# Patient Record
Sex: Male | Born: 1980 | Race: White | Marital: Single | Smoking: Never smoker
Health system: Southern US, Community
[De-identification: ages and names within clinical notes are randomized; demographics above are authoritative.]

## PROBLEM LIST (undated history)

## (undated) DIAGNOSIS — E785 Hyperlipidemia, unspecified: Secondary | ICD-10-CM

## (undated) HISTORY — PX: TONSILLECTOMY: SUR1361

## (undated) HISTORY — PX: HERNIA REPAIR: SHX51

---

## 2009-11-04 ENCOUNTER — Emergency Department: Admit: 2009-11-04 | Payer: Self-pay | Source: Emergency Department | Admitting: Emergency Medicine

## 2009-11-04 LAB — URINALYSIS, REFLEX TO MICROSCOPIC EXAM IF INDICATED
Bilirubin, UA: NEGATIVE
Glucose, UA: NEGATIVE
Ketones UA: NEGATIVE
Leukocyte Esterase, UA: NEGATIVE
Nitrite, UA: NEGATIVE
Protein, UR: NEGATIVE
Specific Gravity UA POCT: <=1.005 (ref 1.001–1.035)
Urine pH: 6.5 (ref 5.0–8.0)
Urobilinogen, UA: 0.2 mg/dL

## 2013-06-14 ENCOUNTER — Ambulatory Visit (INDEPENDENT_AMBULATORY_CARE_PROVIDER_SITE_OTHER): Payer: 59 | Admitting: Geriatric Medicine

## 2013-06-14 VITALS — BP 113/73 | HR 93 | Temp 98.5°F | Resp 18 | Ht 67.0 in | Wt 176.6 lb

## 2013-06-14 NOTE — Patient Instructions (Addendum)
Viral Gastroenteritis (6Yr-Adult)    Gastroenteritis is another name for the"stomach flu."It is most often caused by a virus that affects the stomach and intestinal tract. Symptoms include stomach cramping and fever, vomiting and/or diarrhea, and can last from 2 to 7 days.  The danger from repeated vomiting or diarrhea is dehydration. This is the loss of too much water and minerals from the body. When this occurs, body fluids must be replaced. Antibiotics are not effective for this illness, but simple home treatment will be helpful.  Home Care   If symptoms are severe, rest at home for the next 24 hours.   Avoid tobacco, caffeine, and alcohol use, which can worsen symptoms.   Acetaminophen (Tylenol) or ibuprofen (Motrin, Advil) may be usedfor fever or pain unless another medication was prescribed. NOTE: If you have chronic liver or kidney disease or ever had a stomach ulcer or GI bleeding, talk with your doctor before using these medicines. Aspirin should never be used in anyone under 18 years of age who is ill with a fever. It may cause severe liver damage.   If medicines for diarrhea or vomiting were prescribed, be sure they are takenonly as directed.   If vomiting, drink small amounts of clear fluids (such as water, sports drinks, clear sodas) at frequent intervals to prevent dehydration. Start with 1 to 2 tablespoons every 10 minutes. Once vomiting stops, follow these guidelines:  During The First 12 To 24 Hours follow the diet below:   Beverages: Sport drinks like Gatorade, soft drinks without caffeine; ginger ale, mineral water (plain or flavored), decaffeinated tea and coffee.   Soups: Clear broth, consomm and bouillon   Desserts: Plain gelatin (Jell-O), Popsicles and fruit juice bars.  During The Next 24 Hours you may add the following to the above:   Hot cereal, plain toast, bread, rolls, crackers   Plain noodles, rice, mashed potatoes, chicken noodle or rice soup   Unsweetened canned  fruit (avoid pineapple), bananas   Limit fat intake to less than 15 grams per day by avoiding margarine, butter, oils, mayonnaise, sauces, gravies, fried foods, peanut butter, meat, poultry, and fish.   Limit fiber; avoid raw or cooked vegetables, fresh fruits (except bananas), and bran cereals.   Limit caffeine and chocolate. Do not use spices or seasonings except salt.  During The Next 24 Hours   The patient can gradually resume a normal diet as symptoms lessen.  Preventing Spread   Hand washing with soap and water is the best way to prevent the spread of viruses. Caregivers should wash their hands before andafter touching the sick person.   The sick person, as well as everyone in the family,should wash their hands after using the toilet and before meals.   Clean the toilet after each use.   People with diarrhea should not prepare food for others. If you are preparing your own foods, wash your hands before and after.  Follow Up  with your doctor as advised. Call your doctor if you are not improving over the next 2 to 3 days. If a stool (diarrhea) sample was taken, you may call in 2 days (or as directed) for the results.  Get Prompt Medical Attention  if any of the following occur:   Increasing abdominal pain   Continued vomiting (unable to keep liquids down)   Frequent diarrhea (more than 5 times a day)   Blood in vomit or stool (black or red color)   Dark urine, reduced urine output, or   extreme thirst   Weakness, dizziness, fainting   Drowsiness, confusion, stiff neck, or seizure   Fever of 100.4F (38C) oral or higher, not better with fever medication   New rash   2000-2014 [employer], [address], [place] [address]. All rights reserved. This information is not intended as a substitute for professional medical care. Always follow your healthcare professional's instructions.

## 2013-06-14 NOTE — Progress Notes (Signed)
Subjective:       Patient ID: Dean Nelson is a 32 y.o. male.    Emesis   This is a new problem. The current episode started yesterday. The problem occurs less than 2 times per day. Associated symptoms include diarrhea and a fever. Pertinent negatives include no coughing, headaches, myalgias or sweats. Risk factors include suspect food intake.     Pt started having diarrhea and vomiting yesterday. Vomited twice and had about 5-6 BMs. Stools were watery- no blood. Abd cramps. Pt ate a pizza the night before..  Low grade fever.  Denies sore throat/ runny nose./ cough No congestion. No body aches.No diarrhea since 4 pm yesterday, no vomiting since 1 pm yesterday.  The following portions of the patient's history were reviewed and updated as appropriate: allergies, current medications, past family history, past medical history, past social history, past surgical history and problem list.  No past medical history on file.    History     Social History   . Marital Status: Single     Spouse Name: N/A     Number of Children: N/A   . Years of Education: N/A     Occupational History   . Not on file.     Social History Main Topics   . Smoking status: Not on file   . Smokeless tobacco: Not on file   . Alcohol Use: Not on file   . Drug Use: Not on file   . Sexually Active: Not on file     Other Topics Concern   . Not on file     Social History Narrative   . No narrative on file       No current outpatient prescriptions on file.       Allergies   Allergen Reactions   . Ceclor (Cefaclor)    . Shellfish-Derived Products        No past surgical history on file.    No family history on file.    Review of Systems   Constitutional: Positive for fever.   Respiratory: Negative for cough.    Gastrointestinal: Positive for vomiting and diarrhea.   Musculoskeletal: Negative for myalgias.   Neurological: Negative for headaches.           Objective:    Physical Exam   Constitutional: He is oriented to person, place, and time. He appears  well-developed and well-nourished. No distress.   HENT:   Head: Normocephalic and atraumatic.   Right Ear: External ear normal.   Left Ear: External ear normal.   Eyes: Conjunctivae normal and EOM are normal. Pupils are equal, round, and reactive to light.   Neck: Normal range of motion. Neck supple.   Cardiovascular: Normal rate, regular rhythm, normal heart sounds and intact distal pulses.    Pulmonary/Chest: Effort normal. No respiratory distress. He has no wheezes. He has no rales. He exhibits no tenderness.   Abdominal: Soft. Bowel sounds are normal. He exhibits no distension and no mass. There is no tenderness.   Musculoskeletal: Normal range of motion. He exhibits no edema and no tenderness.   Lymphadenopathy:     He has no cervical adenopathy.   Neurological: He is alert and oriented to person, place, and time. He has normal reflexes. No cranial nerve deficit.   Psychiatric: He has a normal mood and affect. His behavior is normal.     Body mass index is 27.65 kg/(m^2).  Filed Vitals:    06/14/13 1127  BP: 113/73   Pulse: 93   Temp: 98.5 F (36.9 C)   TempSrc: Tympanic   Resp: 18   Height: 1.702 m (5\' 7" )   Weight: 80.105 kg (176 lb 9.6 oz)         Assessment:       Gastroenteritis- resolved        Plan:       Bland food- crackers/ broth/ dried toast. Advance diet as tolerated. Still feels weak- Pedialyte to replete lost lytes.

## 2013-08-21 ENCOUNTER — Encounter (INDEPENDENT_AMBULATORY_CARE_PROVIDER_SITE_OTHER): Payer: Self-pay

## 2013-08-30 ENCOUNTER — Encounter (INDEPENDENT_AMBULATORY_CARE_PROVIDER_SITE_OTHER): Payer: 59 | Admitting: Geriatric Medicine

## 2013-09-01 ENCOUNTER — Encounter (INDEPENDENT_AMBULATORY_CARE_PROVIDER_SITE_OTHER): Payer: Self-pay | Admitting: Geriatric Medicine

## 2013-09-01 ENCOUNTER — Ambulatory Visit (INDEPENDENT_AMBULATORY_CARE_PROVIDER_SITE_OTHER): Payer: 59 | Admitting: Geriatric Medicine

## 2013-09-01 VITALS — BP 111/63 | HR 68 | Temp 97.1°F | Resp 16 | Ht 67.0 in | Wt 182.8 lb

## 2013-09-01 DIAGNOSIS — Z0000 Encounter for general adult medical examination without abnormal findings: Secondary | ICD-10-CM

## 2013-09-01 DIAGNOSIS — B351 Tinea unguium: Secondary | ICD-10-CM | POA: Insufficient documentation

## 2013-09-01 MED ORDER — EFINACONAZOLE 10 % EX SOLN
1.00 | Freq: Every day | CUTANEOUS | Status: DC
Start: 2013-09-01 — End: 2014-03-05

## 2013-09-01 NOTE — Patient Instructions (Signed)
See me once yearly for annual preventative visit.  This is different and separate from any problem-related visit.      Get a flu vaccine yearly in the fall.  Increase vegetables, fruits, and fiber in your diet.  Exercise at least 30 minutes 5 days per week.  Always wear your seatbelt in the car.  Wear sunscreen that is broad-spectrum (UVA/UVB) and at least SPF 30.  Avoid heavy drinking (>14 drinks per week).  Goal Blood Pressure <140/90  Schedule dental exam and cleaning every 6 months  Have your vision checked every 1-2 years.    Next colonoscopy due age 52    The best website for medical information is called UpToDate.  It's free for patients.  [email or web address]  The [facility] website also has good information.  [email or web address].[email or web address]    male

## 2013-09-01 NOTE — Progress Notes (Signed)
Subjective:       Patient ID: Dean Nelson is a 33 y.o. male.    HPIPt presents for a physical. He has no complaints. Saw GI in Feb- was having abd discomfort. Has improved. 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 Dental appt today. Eye exam within the last 6 months.    The following portions of the patient's history were reviewed and updated as appropriate: allergies, current medications, past family history, past medical history, past social history, past surgical history and problem list.  No past medical history on file.    History     Social History   . Marital Status: Single     Spouse Name: N/A     Number of Children: N/A   . Years of Education: N/A     Occupational History   . Not on file.     Social History Main Topics   . Smoking status: Not on file   . Smokeless tobacco: Not on file   . Alcohol Use: Not on file   . Drug Use: Not on file   . Sexually Active: Not on file     Other Topics Concern   . Not on file     Social History Narrative   . No narrative on file       No current outpatient prescriptions on file.       Allergies   Allergen Reactions   . Ceclor (Cefaclor)    . Shellfish-Derived Products        No past surgical history on file.    No family history on file.    Review of Systems   Constitutional: Negative for fever, activity change, appetite change, fatigue and unexpected weight change.   HENT: Positive for congestion. Negative for hearing loss, postnasal drip, sore throat, tinnitus, trouble swallowing and voice change.    Eyes: Negative for photophobia and visual disturbance.   Respiratory: Negative for cough, chest tightness, shortness of breath and wheezing.    Cardiovascular: Negative for chest pain, palpitations and leg swelling.   Gastrointestinal: Negative for nausea, vomiting, abdominal pain, diarrhea, constipation, blood in stool and abdominal distention.   Genitourinary: Negative for dysuria, urgency, frequency, flank pain, discharge, penile swelling,  scrotal swelling, difficulty urinating, penile pain and testicular pain.   Musculoskeletal: Negative for arthralgias, back pain, joint swelling, myalgias and neck pain.   Neurological: Negative for dizziness, weakness, numbness and headaches.   Hematological: Does not bruise/bleed easily.   Psychiatric/Behavioral: Negative for confusion, sleep disturbance, dysphoric mood and decreased concentration. The patient is not nervous/anxious.            Objective:    Physical Exam   Constitutional: He is oriented to person, place, and time. He appears well-developed and well-nourished. No distress.   HENT:   Head: Normocephalic.   Right Ear: External ear normal.   Left Ear: External ear normal.   Mouth/Throat: Oropharynx is clear and moist. No oropharyngeal exudate.   Eyes: Conjunctivae normal and EOM are normal. Pupils are equal, round, and reactive to light. Right eye exhibits no discharge. Left eye exhibits no discharge.   Neck: Normal range of motion. Neck supple. No thyromegaly present.   Cardiovascular: Normal rate, regular rhythm, normal heart sounds and intact distal pulses.  Exam reveals no gallop and no friction rub.    No murmur heard.  Pulmonary/Chest: Effort normal and breath sounds normal. No respiratory distress. He has no wheezes.   Abdominal: Soft.  Bowel sounds are normal. He exhibits no distension and no mass. There is no tenderness. Hernia confirmed negative in the right inguinal area and confirmed negative in the left inguinal area.   Genitourinary: Rectum normal, prostate normal, testes normal and penis normal. Guaiac negative stool. Right testis shows no mass and no tenderness. Left testis shows no mass and no tenderness. Circumcised. No penile tenderness.   Musculoskeletal: Normal range of motion. He exhibits no edema and no tenderness.   Lymphadenopathy:     He has no cervical adenopathy.        Right: No inguinal adenopathy present.        Left: No inguinal adenopathy present.   Neurological: He is  alert and oriented to person, place, and time. He has normal reflexes. No cranial nerve deficit. He exhibits normal muscle tone. Coordination normal.   Skin: Skin is warm and dry. No rash noted.   Psychiatric: He has a normal mood and affect.     Filed Vitals:    09/01/13 1004   BP: 111/63   Pulse: 68   Temp: 97.1 F (36.2 C)   TempSrc: Tympanic   Resp: 16   Height: 1.702 m (5\' 7" )   Weight: 82.918 kg (182 lb 12.8 oz)   SpO2: 98%           Assessment:       1. Annual physical exam  CBC and differential    Comprehensive metabolic panel    Lipid panel    TSH    Urinalysis    Vitamin D 25 hydroxy           Plan:     see above  Nutrition, Contraception, physical activity, healthy weight, avoidance of tobacco, alcohol and drugs, Sexual behavior and STD's,, dental and mental health,, immunizations, screening

## 2013-09-04 ENCOUNTER — Encounter (INDEPENDENT_AMBULATORY_CARE_PROVIDER_SITE_OTHER): Payer: Self-pay | Admitting: Geriatric Medicine

## 2013-09-04 ENCOUNTER — Other Ambulatory Visit (INDEPENDENT_AMBULATORY_CARE_PROVIDER_SITE_OTHER): Payer: Self-pay | Admitting: Geriatric Medicine

## 2013-09-04 DIAGNOSIS — E559 Vitamin D deficiency, unspecified: Secondary | ICD-10-CM

## 2013-09-04 LAB — CBC AND DIFFERENTIAL
Atypical Lymphocytes %: 0 %
Baso(Absolute): 32 cells/uL (ref 0–200)
Basophils: 0.7 %
Eosinophils Absolute: 144 cells/uL (ref 15–500)
Eosinophils: 3.2 %
Hematocrit: 44.6 % (ref 38.5–50.0)
Hemoglobin: 15 g/dL (ref 13.2–17.1)
Lymphocytes Absolute: 1517 cells/uL (ref 850–3900)
Lymphocytes: 33.7 %
MCH: 29.4 pg (ref 27–33)
MCHC: 33.6 g/dL (ref 32–36)
MCV: 87 fL (ref 80–100)
MPV: 7.9 fL (ref 7.5–11.5)
Monocytes Absolute: 315 cells/uL (ref 200–950)
Monocytes: 7 %
Neutrophils Absolute: 2493 cells/uL (ref 1500–7800)
Neutrophils: 55.4 %
Platelets: 275 10*3/uL (ref 140–400)
RBC: 5.11 10*6/uL (ref 4.20–5.80)
RDW: 13.9 % (ref 11.0–15.0)
WBC: 4.5 10*3/uL (ref 3.8–10.8)

## 2013-09-04 LAB — COMPREHENSIVE METABOLIC PANEL
ALT: 12 U/L (ref 9–46)
AST (SGOT): 17 U/L (ref 10–40)
Albumin/Globulin Ratio: 1.7 (ref 1.0–2.5)
Albumin: 4.7 G/DL (ref 3.6–5.1)
Alkaline Phosphatase: 84 U/L (ref 40–115)
BUN: 11 MG/DL (ref 7–25)
Bilirubin, Total: 0.6 MG/DL (ref 0.2–1.2)
CO2: 29 mmol/L (ref 19–30)
Calcium: 9.9 MG/DL (ref 8.6–10.3)
Chloride: 103 mmol/L (ref 98–110)
Creatinine: 0.9 mg/dL (ref 0.60–1.35)
EGFR African American: 132 mL/min/{1.73_m2} (ref >=60)
EGFR: 114 mL/min/{1.73_m2} (ref >=60)
Globulin: 2.8 G/DL (ref 1.9–3.7)
Glucose: 84 MG/DL (ref 65–99)
Potassium: 4.2 mmol/L (ref 3.5–5.3)
Protein, Total: 7.5 G/DL (ref 6.1–8.1)
Sodium: 140 mmol/L (ref 135–146)

## 2013-09-04 LAB — URINALYSIS
Bilirubin, UA: NEGATIVE
Blood, UA: NEGATIVE
Glucose Qualitative: NEGATIVE
Ketones UA: NEGATIVE
Leukocyte Esterase, UA: NEGATIVE
NITRITE: NEGATIVE
Protein, UA: NEGATIVE
Specific Gravity, UA: 1.004 (ref 1.001–1.035)
pH: 7.5 (ref 5.0–8.0)

## 2013-09-04 LAB — VITAMIN D-25 HYDROXY (D2/D3/TOTAL)
25-Hydroxy D2: <4 ng/mL
Vitamin D 25-OH D3: 16 ng/mL
Vitamin D 25-OH Total: 16 ng/mL — ABNORMAL LOW (ref 30–100)

## 2013-09-04 LAB — LIPID PANEL
Cholesterol / HDL Ratio: 4.7 (ref 0.0–5.0)
Cholesterol: 228 MG/DL — ABNORMAL HIGH (ref 125–200)
HDL: 49 mg/dL (ref >=40)
LDL Calculated: 156 mg/dL — ABNORMAL HIGH (ref <130)
Non HDL Cholesterol (LDL and VLDL): 179 mg/dL — AB
Triglycerides: 113 MG/DL (ref <150)

## 2013-09-04 LAB — TSH: TSH: 0.94 mIU/L (ref 0.40–4.50)

## 2013-09-04 MED ORDER — VITAMIN D (ERGOCALCIFEROL) 1.25 MG (50000 UT) PO CAPS
50000.00 [IU] | ORAL_CAPSULE | ORAL | Status: DC
Start: 2013-09-04 — End: 2014-03-09

## 2013-09-05 ENCOUNTER — Encounter (INDEPENDENT_AMBULATORY_CARE_PROVIDER_SITE_OTHER): Payer: Self-pay

## 2014-02-15 ENCOUNTER — Ambulatory Visit (INDEPENDENT_AMBULATORY_CARE_PROVIDER_SITE_OTHER): Payer: 59 | Admitting: Geriatric Medicine

## 2014-02-21 ENCOUNTER — Ambulatory Visit (INDEPENDENT_AMBULATORY_CARE_PROVIDER_SITE_OTHER): Payer: Self-pay | Admitting: Geriatric Medicine

## 2014-03-05 ENCOUNTER — Ambulatory Visit (INDEPENDENT_AMBULATORY_CARE_PROVIDER_SITE_OTHER): Payer: Commercial Managed Care - POS | Admitting: Geriatric Medicine

## 2014-03-05 ENCOUNTER — Encounter (INDEPENDENT_AMBULATORY_CARE_PROVIDER_SITE_OTHER): Payer: Self-pay | Admitting: Geriatric Medicine

## 2014-03-05 VITALS — BP 120/63 | HR 57 | Temp 97.9°F | Resp 18 | Ht 67.0 in | Wt 184.4 lb

## 2014-03-05 DIAGNOSIS — Z23 Encounter for immunization: Secondary | ICD-10-CM

## 2014-03-05 DIAGNOSIS — E785 Hyperlipidemia, unspecified: Secondary | ICD-10-CM | POA: Insufficient documentation

## 2014-03-05 DIAGNOSIS — B351 Tinea unguium: Secondary | ICD-10-CM

## 2014-03-05 DIAGNOSIS — E559 Vitamin D deficiency, unspecified: Secondary | ICD-10-CM

## 2014-03-05 MED ORDER — CICLOPIROX 8 % EX SOLN
Freq: Every evening | CUTANEOUS | Status: DC
Start: 2014-03-05 — End: 2014-07-26

## 2014-03-05 NOTE — Progress Notes (Signed)
Subjective:       Patient ID: Dean Nelson is a 33 y.o. male.    Hyperlipidemia  This is a chronic problem. The current episode started more than 1 month ago. The problem is uncontrolled. Recent lipid tests were reviewed and are variable. He has no history of chronic renal disease, diabetes, liver disease, obesity or nephrotic syndrome. Pertinent negatives include no focal weakness, leg pain or shortness of breath. Current antihyperlipidemic treatment includes exercise and diet change (diet has improved recently. He exercises at least twice a week). Compliance problems: Admits that he cld exercise more.  Risk factors for coronary artery disease include male sex.   Recently changed jobs and is working in [employer] sector. Used to work for [employer].    Has onychomycosis- cld not afford Jublia. Wants to try an alternative  He c/o discomfort on the lt lower lumbar region esp 1-2 days after he exercises. He plays tennis.   The following portions of the patient's history were reviewed and updated as appropriate: allergies, current medications, past family history, past medical history, past social history, past surgical history and problem list.  No past medical history on file.    History     Social History   . Marital Status: Single     Spouse Name: N/A     Number of Children: N/A   . Years of Education: N/A     Occupational History   . Not on file.     Social History Main Topics   . Smoking status: Never Smoker    . Smokeless tobacco: Not on file   . Alcohol Use: Yes   . Drug Use: No   . Sexual Activity:     Partners: Female     Other Topics Concern   . Not on file     Social History Narrative   . No narrative on file       Current Outpatient Prescriptions   Medication Sig Dispense Refill   . ciclopirox (PENLAC) 8 % solution Apply topically nightly. Apply over nail and surrounding skin. Apply daily over previous coat. After 7 days, may remove with alcohol 6.6 mL 5   . Vitamin D, Ergocalciferol, (DRISDOL) 50000  UNIT Cap Take 1 capsule (50,000 Units total) by mouth once a week. 4 capsule 5     No current facility-administered medications for this visit.       Allergies   Allergen Reactions   . Ceclor [Cefaclor]    . Shellfish-Derived Products        No past surgical history on file.    No family history on file.    Review of Systems   Respiratory: Negative for cough and shortness of breath.    Genitourinary: Negative for frequency and flank pain.   Musculoskeletal: Positive for back pain (c/o intermittent discomfort lt lower lumbar region. ).   Neurological: Negative for focal weakness.           Objective:    Physical Exam   Constitutional: He is oriented to person, place, and time. He appears well-developed and well-nourished. No distress.   Cardiovascular: Normal rate and regular rhythm.    Pulmonary/Chest: No respiratory distress. He has no wheezes.   Musculoskeletal: He exhibits no tenderness.   Neurological: He is alert and oriented to person, place, and time.     Filed Vitals:    03/05/14 0727   BP: 120/63   Pulse: 57   Temp: 97.9 F (36.6 C)  Resp: 18           Assessment:       1. Onychomycosis  ciclopirox (PENLAC) 8 % solution   2. HLD (hyperlipidemia)  Lipid panel   3. Vitamin D deficiency  Vitamin D- 25 Hydroxy (D2/D3/Total)   4. Flu vaccine need  Flu vaccine            Plan:      Procedures    See above. F/u results   I reviewed his recent lab results and counseled him on his cholesterol panel and its implications for cardiovascular risk and health.    At this time I recommend continuation  diet and exercisewith a statin with a goal LDL of< 130     At this time I recommend continuation  of dietary measures, weight loss, and exercise with a goal of reducing LDL to  < 130    I counseled him on dietary and exercise measures to reduce cholesterol .      Risk & Benefits of the new medication(s) were explained to the pt (and family) who appeared to understand & agree to the treatment plan.

## 2014-03-07 LAB — VITAMIN D-25 HYDROXY (D2/D3/TOTAL)
25-Hydroxy D2: <4 ng/mL
Vitamin D 25-OH D3: 27 ng/mL
Vitamin D 25-OH Total: 27 ng/mL — ABNORMAL LOW (ref 30–100)

## 2014-03-07 LAB — LIPID PANEL
Cholesterol / HDL Ratio: 4.1 (calc) (ref 0.0–5.0)
Cholesterol: 237 mg/dL — ABNORMAL HIGH (ref 125–200)
HDL: 58 mg/dL (ref >=40)
LDL Calculated: 154 mg/dL — ABNORMAL HIGH (ref <130)
Non HDL Cholesterol (LDL and VLDL): 179 mg/dL — AB
Triglycerides: 126 mg/dL (ref <150)

## 2014-03-09 ENCOUNTER — Other Ambulatory Visit (INDEPENDENT_AMBULATORY_CARE_PROVIDER_SITE_OTHER): Payer: Self-pay | Admitting: Geriatric Medicine

## 2014-03-09 ENCOUNTER — Telehealth (INDEPENDENT_AMBULATORY_CARE_PROVIDER_SITE_OTHER): Payer: Self-pay | Admitting: Geriatric Medicine

## 2014-03-09 DIAGNOSIS — E559 Vitamin D deficiency, unspecified: Secondary | ICD-10-CM

## 2014-03-09 MED ORDER — VITAMIN D (ERGOCALCIFEROL) 1.25 MG (50000 UT) PO CAPS
50000.00 [IU] | ORAL_CAPSULE | ORAL | Status: DC
Start: 2014-03-09 — End: 2014-11-07

## 2014-03-09 NOTE — Telephone Encounter (Signed)
Pt wished to F/U on lab results and Rx concerns; wished to know if based on lab results would he be able to not take medication and instead change his diet to try and adjust lvl's. Pt wished to know if this would be an appropriate course and would like to F/U with PCP, Pt can be best reached at [phone] (M)

## 2014-05-02 ENCOUNTER — Ambulatory Visit (INDEPENDENT_AMBULATORY_CARE_PROVIDER_SITE_OTHER): Payer: Commercial Managed Care - POS | Admitting: Geriatric Medicine

## 2014-06-22 ENCOUNTER — Encounter (INDEPENDENT_AMBULATORY_CARE_PROVIDER_SITE_OTHER): Payer: Self-pay | Admitting: Geriatric Medicine

## 2014-07-26 ENCOUNTER — Ambulatory Visit (INDEPENDENT_AMBULATORY_CARE_PROVIDER_SITE_OTHER): Payer: Commercial Managed Care - POS | Admitting: Geriatric Medicine

## 2014-07-26 ENCOUNTER — Encounter (INDEPENDENT_AMBULATORY_CARE_PROVIDER_SITE_OTHER): Payer: Self-pay | Admitting: Geriatric Medicine

## 2014-07-26 ENCOUNTER — Other Ambulatory Visit: Payer: Commercial Managed Care - POS

## 2014-07-26 VITALS — BP 114/74 | HR 57 | Temp 97.0°F | Resp 18 | Ht 67.0 in | Wt 182.0 lb

## 2014-07-26 DIAGNOSIS — Z1389 Encounter for screening for other disorder: Secondary | ICD-10-CM

## 2014-07-26 DIAGNOSIS — R590 Localized enlarged lymph nodes: Secondary | ICD-10-CM

## 2014-07-26 DIAGNOSIS — Z0000 Encounter for general adult medical examination without abnormal findings: Secondary | ICD-10-CM

## 2014-07-26 DIAGNOSIS — Z1283 Encounter for screening for malignant neoplasm of skin: Secondary | ICD-10-CM

## 2014-07-26 DIAGNOSIS — Z23 Encounter for immunization: Secondary | ICD-10-CM

## 2014-07-26 DIAGNOSIS — Z1331 Encounter for screening for depression: Secondary | ICD-10-CM

## 2014-07-26 NOTE — Patient Instructions (Signed)
See me once yearly for annual preventative visit.  This is different and separate from any problem-related visit.      Get a flu vaccine yearly in the fall.  Increase vegetables, fruits, and fiber in your diet.  Exercise at least 30 minutes 5 days per week.  Always wear your seatbelt in the car.  Wear sunscreen that is broad-spectrum (UVA/UVB) and at least SPF 30.  Avoid heavy drinking (>14 drinks per week).  Goal Blood Pressure <140/90  Schedule dental exam and cleaning every 6 months  Have your vision checked every 1-2 years.    Next colonoscopy due age 52    The best website for medical information is called UpToDate.  It's free for patients.  [email or web address]  The [facility] website also has good information.  [email or web address].[email or web address]    male

## 2014-07-26 NOTE — Progress Notes (Signed)
Subjective:       Patient ID: Dean Nelson is a 34 y.o. male.    HPIPt presents for a physical exam. Pt and wife are expecting a baby in August. He has a h/o HLD  and has been watching his diet and exercising. Dental- 1 yr ago. Has not had a recent TDAP.    The following portions of the patient's history were reviewed and updated as appropriate: allergies, current medications, past family history, past medical history, past social history, past surgical history and problem list.  No past medical history on file.    History     Social History   . Marital Status: Single     Spouse Name: N/A     Number of Children: N/A   . Years of Education: N/A     Occupational History   . Not on file.     Social History Main Topics   . Smoking status: Never Smoker    . Smokeless tobacco: Not on file   . Alcohol Use: Yes   . Drug Use: No   . Sexual Activity:     Partners: Female     Other Topics Concern   . Not on file     Social History Narrative       Current Outpatient Prescriptions   Medication Sig Dispense Refill   . cetirizine (ZYRTEC) 10 MG tablet Take 10 mg by mouth daily.     . Vitamin D, Ergocalciferol, (DRISDOL) 50000 UNIT Cap Take 1 capsule (50,000 Units total) by mouth once a week. 4 capsule 3     No current facility-administered medications for this visit.       Allergies   Allergen Reactions   . Ceclor [Cefaclor]    . Shellfish-Derived Products        No past surgical history on file.    Family History   Problem Relation Age of Onset   . Cancer Maternal Grandmother    . Hepatitis Paternal Grandmother        Review of Systems   Constitutional: Negative for fever, activity change, appetite change, fatigue and unexpected weight change.   HENT: Negative for congestion, hearing loss, postnasal drip, sore throat, tinnitus, trouble swallowing and voice change.    Eyes: Negative for photophobia and visual disturbance.   Respiratory: Negative for cough, chest tightness, shortness of breath and wheezing.     Cardiovascular: Negative for chest pain, palpitations and leg swelling.   Gastrointestinal: Negative for nausea, vomiting, abdominal pain, diarrhea, constipation, blood in stool and abdominal distention.   Genitourinary: Negative for dysuria, urgency, frequency, flank pain, discharge, penile swelling, scrotal swelling, difficulty urinating, penile pain and testicular pain.   Musculoskeletal: Negative for myalgias, back pain, joint swelling, arthralgias and neck pain.   Neurological: Negative for dizziness, weakness, numbness and headaches.   Hematological: Does not bruise/bleed easily.   Psychiatric/Behavioral: Negative for confusion, sleep disturbance, dysphoric mood and decreased concentration. The patient is not nervous/anxious.            Objective:    Physical Exam   Constitutional: He is oriented to person, place, and time. He appears well-developed and well-nourished. No distress.   HENT:   Head: Normocephalic.   Right Ear: External ear normal.   Left Ear: External ear normal.   Mouth/Throat: Oropharynx is clear and moist. No oropharyngeal exudate.   Eyes: Conjunctivae and EOM are normal. Pupils are equal, round, and reactive to light. Right eye exhibits no discharge. Left eye  exhibits no discharge.   Neck: Normal range of motion. Neck supple. No thyromegaly present.   Cardiovascular: Normal rate, regular rhythm, normal heart sounds and intact distal pulses.  Exam reveals no gallop and no friction rub.    No murmur heard.  Pulmonary/Chest: Effort normal and breath sounds normal. No respiratory distress. He has no wheezes.   Abdominal: Soft. Bowel sounds are normal. He exhibits no distension and no mass. There is no tenderness. Hernia confirmed negative in the right inguinal area and confirmed negative in the left inguinal area.   Genitourinary: Rectum normal, prostate normal, testes normal and penis normal. Guaiac negative stool. Right testis shows no mass and no tenderness. Left testis shows no mass and no  tenderness. Circumcised. No penile tenderness.   Musculoskeletal: Normal range of motion. He exhibits no edema or tenderness.   Lymphadenopathy:     He has cervical adenopathy (b/l cervical l nodes. Sub centimeter, mobile. Non tender).        Right: No inguinal adenopathy present.        Left: No inguinal adenopathy present.   Neurological: He is alert and oriented to person, place, and time. He has normal reflexes. No cranial nerve deficit. He exhibits normal muscle tone. Coordination normal.   Skin: Skin is warm and dry. No rash noted.   All the toenails are thickened, yellow and deformed- s/p sev failed tx with topical anti fungal topical agents   Psychiatric: He has a normal mood and affect.   Ceasar Mons Vitals:    07/26/14 0915   BP: 114/74   Pulse: 57   Temp: 97 F (36.1 C)   Resp: 18   SpO2: 99%           Assessment:       1. Annual physical exam  CBC and differential    Comprehensive Metabolic Panel    TSH    Urinalysis    Lipid panel    Vitamin D,25 OH, Total   2. Need for Tdap vaccination  Tdap vaccine greater than or equal to 7yo IM          Plan:    see above  Procedures    Nutrition, Contraception, physical activity, healthy weight, avoidance of tobacco, alcohol and drugs, Sexual behavior and STD's,, dental and mental health,, immunizations, screening- D/w pt    ,

## 2014-07-26 NOTE — Progress Notes (Signed)
Pt tolerated the IM TDAP vaccination well and w/out complications in the Left deltoid   Manufacture - [employer]   Lot - [id number]   Exp - 09/08/16  NDC - 82956-213-08  Pt waited the recommended 15 minutes after administration of the vaccination and did not experience any adverse reactions and or complications

## 2014-07-27 LAB — CBC AND DIFFERENTIAL
Atypical Lymphocytes %: 0 %
Baso(Absolute): 20 cells/uL (ref 0–200)
Basophils: 0.4 %
Eosinophils Absolute: 147 cells/uL (ref 15–500)
Eosinophils: 3 %
Hematocrit: 45.5 % (ref 38.5–50.0)
Hemoglobin: 15.2 g/dL (ref 13.2–17.1)
Lymphocytes Absolute: 1749 cells/uL (ref 850–3900)
Lymphocytes: 35.7 %
MCH: 28.8 pg (ref 27–33)
MCHC: 33.4 g/dL (ref 32–36)
MCV: 86 fL (ref 80–100)
MPV: 7.9 fL (ref 7.5–11.5)
Monocytes Absolute: 304 cells/uL (ref 200–950)
Monocytes: 6.2 %
Neutrophils Absolute: 2680 cells/uL (ref 1500–7800)
Neutrophils: 54.7 %
Platelets: 275 10*3/uL (ref 140–400)
RBC: 5.27 10*6/uL (ref 4.20–5.80)
RDW: 13 % (ref 11.0–15.0)
WBC: 4.9 10*3/uL (ref 3.8–10.8)

## 2014-07-27 LAB — URINALYSIS
Bilirubin, UA: NEGATIVE
Blood, UA: NEGATIVE
Glucose Qualitative: NEGATIVE
Ketones UA: NEGATIVE
Leukocyte Esterase, UA: NEGATIVE
NITRITE: NEGATIVE
Protein, UA: NEGATIVE
Specific Gravity, UA: 1.013 (ref 1.001–1.035)
pH: 6.5 (ref 5.0–8.0)

## 2014-07-27 LAB — COMPREHENSIVE METABOLIC PANEL
ALT: 16 U/L (ref 9–46)
AST (SGOT): 19 U/L (ref 10–40)
Albumin/Globulin Ratio: 1.8 (ref 1.0–2.5)
Albumin: 4.6 G/DL (ref 3.6–5.1)
Alkaline Phosphatase: 80 U/L (ref 40–115)
BUN: 15 MG/DL (ref 7–25)
Bilirubin, Total: 0.5 MG/DL (ref 0.2–1.2)
CO2: 26 mmol/L (ref 19–30)
Calcium: 9.8 MG/DL (ref 8.6–10.3)
Chloride: 103 mmol/L (ref 98–110)
Creatinine: 0.95 mg/dL (ref 0.60–1.35)
EGFR African American: 123 mL/min/{1.73_m2} (ref >=60)
EGFR: 106 mL/min/{1.73_m2} (ref >=60)
Globulin: 2.5 G/DL (ref 1.9–3.7)
Glucose: 88 MG/DL (ref 65–99)
Potassium: 4.2 mmol/L (ref 3.5–5.3)
Protein, Total: 7.1 G/DL (ref 6.1–8.1)
Sodium: 139 mmol/L (ref 135–146)

## 2014-07-27 LAB — VITAMIN D,25 OH,TOTAL: Vitamin D, 25 OH, Total: 31 ng/mL (ref 30–100)

## 2014-07-27 LAB — LIPID PANEL
Cholesterol / HDL Ratio: 4.5 (calc) (ref 0.0–5.0)
Cholesterol: 239 mg/dL — ABNORMAL HIGH (ref 125–200)
HDL: 53 mg/dL (ref >=40)
LDL Calculated: 159 mg/dL — ABNORMAL HIGH (ref <130)
Non HDL Cholesterol (LDL and VLDL): 186 mg/dL — AB
Triglycerides: 137 mg/dL (ref <150)

## 2014-07-27 LAB — TSH: TSH: 1.55 mIU/L (ref 0.40–4.50)

## 2014-08-02 ENCOUNTER — Encounter (INDEPENDENT_AMBULATORY_CARE_PROVIDER_SITE_OTHER): Payer: Self-pay | Admitting: Internal Medicine

## 2014-08-02 ENCOUNTER — Telehealth (INDEPENDENT_AMBULATORY_CARE_PROVIDER_SITE_OTHER): Payer: Self-pay | Admitting: Geriatric Medicine

## 2014-08-02 ENCOUNTER — Ambulatory Visit (INDEPENDENT_AMBULATORY_CARE_PROVIDER_SITE_OTHER): Payer: Commercial Managed Care - POS | Admitting: Internal Medicine

## 2014-08-02 VITALS — BP 124/67 | HR 85 | Temp 98.3°F | Resp 16 | Ht 67.0 in | Wt 178.0 lb

## 2014-08-02 DIAGNOSIS — M542 Cervicalgia: Secondary | ICD-10-CM

## 2014-08-02 MED ORDER — CYCLOBENZAPRINE HCL 10 MG PO TABS
10.0000 mg | ORAL_TABLET | Freq: Three times a day (TID) | ORAL | Status: DC | PRN
Start: 2014-08-02 — End: 2015-01-07

## 2014-08-02 NOTE — Progress Notes (Signed)
Subjective:       Patient ID: Dean Nelson is a 34 y.o. male.    Neck Pain   This is a new problem. The current episode started in the past 7 days. The problem occurs intermittently. The problem has been waxing and waning. The pain is associated with an unknown factor. The pain is present in the left side. The quality of the pain is described as aching. The pain is at a severity of 5/10. The pain is mild. The symptoms are aggravated by twisting. The pain is same all the time. Pertinent negatives include no chest pain, fever, headaches, leg pain, numbness, pain with swallowing, paresis, photophobia, syncope, tingling, trouble swallowing, visual change, weakness or weight loss. He has tried bed rest for the symptoms. The treatment provided no relief.       The following portions of the patient's history were reviewed and updated as appropriate: allergies, current medications, past family history, past medical history, past social history, past surgical history and problem list.    Review of Systems   Constitutional: Negative for fever, chills and weight loss.   HENT: Negative for trouble swallowing.    Eyes: Negative for photophobia.   Respiratory: Negative for chest tightness and shortness of breath.    Cardiovascular: Negative for chest pain, palpitations and syncope.   Gastrointestinal: Negative for abdominal pain and diarrhea.   Musculoskeletal: Positive for neck pain.        Pain in left collar bone   Neurological: Negative for tingling, weakness, numbness and headaches.           Objective:    Physical Exam   Constitutional: He appears well-developed and well-nourished.   HENT:   Head: Normocephalic and atraumatic.   Eyes: Conjunctivae and EOM are normal. Pupils are equal, round, and reactive to light.   Neck: Normal range of motion. Neck supple. No thyromegaly present.   Cardiovascular: Normal rate, regular rhythm, normal heart sounds and intact distal pulses.    Pulmonary/Chest: Effort normal and breath  sounds normal.   Abdominal: Soft. Bowel sounds are normal. There is no tenderness.   Musculoskeletal: He exhibits no edema.        Cervical back: He exhibits tenderness and spasm. He exhibits no swelling, no edema and no laceration.        Back:    Mild prominence of left clavicular head.   Lymphadenopathy:     He has no cervical adenopathy.           Assessment:       1.Neck spasm, suggest Flexeril 10 mg tid prn, he was advised it may make him drowsy and to avoid driving or using any heavy machinery after taking it.  He is due to undergo ultrasound of his neck tomorrow for evaluation of lymph nodes he has reported to PCP,Do not appreciate any lymphadenopathy clinically today.  Clavicular head prominence is most likely muscular .    Offered xray ,patient declines.  Suggest rest the neck and upper body,Ice,call if symptoms worsen.    Patient verbalized understanding with plan of care         Plan:      Procedures    As above.

## 2014-08-02 NOTE — Telephone Encounter (Signed)
Pt would like to speak with a nurse about test results from Lab work TSH and [facility] sites (SP)  Pt # [phone]

## 2014-08-03 ENCOUNTER — Ambulatory Visit
Admission: RE | Admit: 2014-08-03 | Discharge: 2014-08-03 | Disposition: A | Discharge status: Home / Self Care | Payer: Commercial Managed Care - POS | Source: Ambulatory Visit | Attending: Geriatric Medicine | Admitting: Geriatric Medicine

## 2014-08-03 DIAGNOSIS — R590 Localized enlarged lymph nodes: Secondary | ICD-10-CM | POA: Insufficient documentation

## 2014-08-07 ENCOUNTER — Encounter (INDEPENDENT_AMBULATORY_CARE_PROVIDER_SITE_OTHER): Payer: Self-pay | Admitting: Geriatric Medicine

## 2014-08-09 ENCOUNTER — Encounter (INDEPENDENT_AMBULATORY_CARE_PROVIDER_SITE_OTHER): Payer: Self-pay | Admitting: Geriatric Medicine

## 2014-08-10 ENCOUNTER — Other Ambulatory Visit (INDEPENDENT_AMBULATORY_CARE_PROVIDER_SITE_OTHER): Payer: Self-pay | Admitting: Geriatric Medicine

## 2014-08-10 DIAGNOSIS — E785 Hyperlipidemia, unspecified: Secondary | ICD-10-CM

## 2014-08-10 MED ORDER — ROSUVASTATIN CALCIUM 5 MG PO TABS
5.0000 mg | ORAL_TABLET | Freq: Every day | ORAL | Status: DC
Start: 2014-08-10 — End: 2014-10-23

## 2014-08-29 ENCOUNTER — Encounter (INDEPENDENT_AMBULATORY_CARE_PROVIDER_SITE_OTHER): Payer: Self-pay | Admitting: Geriatric Medicine

## 2014-10-23 ENCOUNTER — Encounter (INDEPENDENT_AMBULATORY_CARE_PROVIDER_SITE_OTHER): Payer: Self-pay | Admitting: Geriatric Medicine

## 2014-10-23 ENCOUNTER — Other Ambulatory Visit (INDEPENDENT_AMBULATORY_CARE_PROVIDER_SITE_OTHER): Payer: Self-pay | Admitting: Geriatric Medicine

## 2014-10-23 DIAGNOSIS — E785 Hyperlipidemia, unspecified: Secondary | ICD-10-CM

## 2014-10-23 MED ORDER — ROSUVASTATIN CALCIUM 5 MG PO TABS
5.0000 mg | ORAL_TABLET | Freq: Every day | ORAL | Status: DC
Start: 2014-10-23 — End: 2015-02-06

## 2014-11-07 ENCOUNTER — Encounter (INDEPENDENT_AMBULATORY_CARE_PROVIDER_SITE_OTHER): Payer: Self-pay | Admitting: Geriatric Medicine

## 2014-11-07 ENCOUNTER — Ambulatory Visit (INDEPENDENT_AMBULATORY_CARE_PROVIDER_SITE_OTHER): Payer: Commercial Managed Care - POS | Admitting: Geriatric Medicine

## 2014-11-07 VITALS — BP 129/74 | HR 60 | Temp 97.2°F | Resp 16 | Ht 67.0 in | Wt 181.0 lb

## 2014-11-07 DIAGNOSIS — M25512 Pain in left shoulder: Secondary | ICD-10-CM

## 2014-11-07 DIAGNOSIS — M25511 Pain in right shoulder: Secondary | ICD-10-CM

## 2014-11-07 DIAGNOSIS — T50905A Adverse effect of unspecified drugs, medicaments and biological substances, initial encounter: Secondary | ICD-10-CM

## 2014-11-07 DIAGNOSIS — E785 Hyperlipidemia, unspecified: Secondary | ICD-10-CM

## 2014-11-07 DIAGNOSIS — K716 Toxic liver disease with hepatitis, not elsewhere classified: Secondary | ICD-10-CM

## 2014-11-07 DIAGNOSIS — T50901A Poisoning by unspecified drugs, medicaments and biological substances, accidental (unintentional), initial encounter: Secondary | ICD-10-CM

## 2014-11-07 NOTE — Progress Notes (Signed)
Subjective:       Patient ID: Dean Nelson is a 34 y.o. male.    Shoulder Pain   This is a chronic (rt shoulder pain 3-4 mo ago. Plays tennis 1-2/ week) problem. The current episode started more than 1 month ago. The problem has been waxing and waning. The pain is at a severity of 5/10. The symptoms are aggravated by activity. He has tried NSAIDS for the symptoms.   Patient is on Crestor.  He drinks about 4 drinks wants to check his liver function    The following portions of the patient's history were reviewed and updated as appropriate: allergies, current medications, past family history, past medical history, past social history, past surgical history and problem list.  No past medical history on file.    History     Social History   . Marital Status: Single     Spouse Name: N/A   . Number of Children: N/A   . Years of Education: N/A     Occupational History   . Not on file.     Social History Main Topics   . Smoking status: Never Smoker    . Smokeless tobacco: Not on file   . Alcohol Use: Yes   . Drug Use: No   . Sexual Activity:     Partners: Female     Other Topics Concern   . Not on file     Social History Narrative       Current Outpatient Prescriptions   Medication Sig Dispense Refill   . cetirizine (ZYRTEC) 10 MG tablet Take 10 mg by mouth daily.     . rosuvastatin (CRESTOR) 5 MG tablet Take 1 tablet (5 mg total) by mouth daily. 90 tablet 3   . cyclobenzaprine (FLEXERIL) 10 MG tablet Take 1 tablet (10 mg total) by mouth 3 (three) times daily as needed for Muscle spasms. 10 tablet 0     No current facility-administered medications for this visit.       Allergies   Allergen Reactions   . Ceclor [Cefaclor]    . Shellfish-Derived Products        Past Surgical History   Procedure Laterality Date   . Hernia repair         Family History   Problem Relation Age of Onset   . Cancer Maternal Grandmother    . Hepatitis Paternal Grandmother        Review of Systems   Musculoskeletal: Positive for arthralgias  (b/l shoulder pain) and neck pain (lt sided neck pain). Negative for myalgias, back pain and joint swelling.           Objective:    Physical Exam   Constitutional: He appears well-nourished.   Cardiovascular: Normal rate, regular rhythm and normal heart sounds.    Musculoskeletal:   Pain rt shoulder on extension , adduction and supination. Pain in the lt side of shoulder radiating up lt side of the neck     Filed Vitals:    11/07/14 1137   BP: 129/74   Pulse: 60   Temp: 97.2 F (36.2 C)   Resp: 16   SpO2: 99%           Assessment:       1. Drug-induced hepatic toxicity  Hepatic function panel (LFT)   2. Bilateral shoulder pain  Ambulatory referral to Physical Therapy    Ambulatory referral to Physical Therapy   3. Hyperlipidemia, unspecified hyperlipidemia  Lipid panel  Plan:      Procedures    1. Drug-induced hepatic toxicity  Hepatic function panel (LFT)   2. Bilateral shoulder pain  Ambulatory referral to Physical Therapy    Ambulatory referral to Physical Therapy   3. Hyperlipidemia, unspecified hyperlipidemia  Lipid panel      follow up results

## 2014-11-08 LAB — HEPATIC FUNCTION PANEL
ALT: 14 IU/L (ref 0–44)
AST (SGOT): 21 IU/L (ref 0–40)
Albumin: 4.9 g/dL (ref 3.5–5.5)
Alkaline Phosphatase: 80 IU/L (ref 39–117)
Bilirubin Direct: 0.12 mg/dL (ref 0.00–0.40)
Bilirubin, Total: 0.5 mg/dL (ref 0.0–1.2)
Protein, Total: 7.3 g/dL (ref 6.0–8.5)

## 2014-11-26 ENCOUNTER — Ambulatory Visit (INDEPENDENT_AMBULATORY_CARE_PROVIDER_SITE_OTHER): Payer: Self-pay | Admitting: Geriatric Medicine

## 2014-11-26 ENCOUNTER — Ambulatory Visit (INDEPENDENT_AMBULATORY_CARE_PROVIDER_SITE_OTHER): Payer: Commercial Managed Care - POS | Admitting: Geriatric Medicine

## 2015-01-05 ENCOUNTER — Encounter (INDEPENDENT_AMBULATORY_CARE_PROVIDER_SITE_OTHER): Payer: Self-pay | Admitting: Internal Medicine

## 2015-01-07 ENCOUNTER — Other Ambulatory Visit (INDEPENDENT_AMBULATORY_CARE_PROVIDER_SITE_OTHER): Payer: Self-pay | Admitting: Geriatric Medicine

## 2015-01-07 ENCOUNTER — Encounter (INDEPENDENT_AMBULATORY_CARE_PROVIDER_SITE_OTHER): Payer: Self-pay | Admitting: Geriatric Medicine

## 2015-01-07 MED ORDER — CYCLOBENZAPRINE HCL 10 MG PO TABS
10.0000 mg | ORAL_TABLET | Freq: Two times a day (BID) | ORAL | Status: DC | PRN
Start: 2015-01-07 — End: 2017-01-04

## 2015-02-06 ENCOUNTER — Other Ambulatory Visit (INDEPENDENT_AMBULATORY_CARE_PROVIDER_SITE_OTHER): Payer: Self-pay | Admitting: Geriatric Medicine

## 2015-02-06 DIAGNOSIS — E785 Hyperlipidemia, unspecified: Secondary | ICD-10-CM

## 2015-02-06 MED ORDER — ROSUVASTATIN CALCIUM 5 MG PO TABS
5.0000 mg | ORAL_TABLET | Freq: Every day | ORAL | Status: DC
Start: 2015-02-06 — End: 2016-03-02

## 2015-02-14 ENCOUNTER — Ambulatory Visit (INDEPENDENT_AMBULATORY_CARE_PROVIDER_SITE_OTHER): Payer: Commercial Managed Care - POS | Admitting: Internal Medicine

## 2015-02-14 ENCOUNTER — Encounter (INDEPENDENT_AMBULATORY_CARE_PROVIDER_SITE_OTHER): Payer: Self-pay | Admitting: Internal Medicine

## 2015-02-14 VITALS — BP 123/74 | HR 90 | Temp 99.3°F | Ht 67.0 in | Wt 181.0 lb

## 2015-02-14 DIAGNOSIS — M791 Myalgia, unspecified site: Secondary | ICD-10-CM

## 2015-02-14 DIAGNOSIS — R509 Fever, unspecified: Secondary | ICD-10-CM

## 2015-02-14 LAB — COMPREHENSIVE METABOLIC PANEL
ALT: 31 U/L (ref 0–55)
AST (SGOT): 28 U/L (ref 5–34)
Albumin/Globulin Ratio: 1.6 (ref 0.9–2.2)
Albumin: 4.4 g/dL (ref 3.5–5.0)
Alkaline Phosphatase: 76 U/L (ref 38–106)
BUN: 10 mg/dL (ref 9.0–28.0)
Bilirubin, Total: 0.4 mg/dL (ref 0.1–1.2)
CO2: 30 mEq/L (ref 21–30)
Calcium: 10.1 mg/dL (ref 8.5–10.5)
Chloride: 105 mEq/L (ref 100–111)
Creatinine: 0.9 mg/dL (ref 0.5–1.5)
Globulin: 2.7 g/dL (ref 2.0–3.7)
Glucose: 89 mg/dL (ref 70–100)
Potassium: 4.5 mEq/L (ref 3.5–5.3)
Protein, Total: 7.1 g/dL (ref 6.0–8.3)
Sodium: 143 mEq/L (ref 135–146)

## 2015-02-14 LAB — CBC AND DIFFERENTIAL
Basophils Absolute Automated: 0.03 10*3/uL (ref 0.00–0.20)
Basophils Automated: 1 %
Eosinophils Absolute Automated: 0.01 10*3/uL (ref 0.00–0.70)
Eosinophils Automated: 0 %
Hematocrit: 44.6 % (ref 42.0–52.0)
Hgb: 14.9 g/dL (ref 13.0–17.0)
Immature Granulocytes Absolute: 0.01 10*3/uL
Immature Granulocytes: 0 %
Lymphocytes Absolute Automated: 0.74 10*3/uL (ref 0.50–4.40)
Lymphocytes Automated: 19 %
MCH: 29.3 pg (ref 28.0–32.0)
MCHC: 33.4 g/dL (ref 32.0–36.0)
MCV: 87.8 fL (ref 80.0–100.0)
MPV: 10.2 fL (ref 9.4–12.3)
Monocytes Absolute Automated: 0.49 10*3/uL (ref 0.00–1.20)
Monocytes: 12 %
Neutrophils Absolute: 2.65 10*3/uL (ref 1.80–8.10)
Neutrophils: 67 %
Nucleated RBC: 0 /100 WBC (ref 0–1)
Platelets: 213 10*3/uL (ref 140–400)
RBC: 5.08 10*6/uL (ref 4.70–6.00)
RDW: 13 % (ref 12–15)
WBC: 3.93 10*3/uL (ref 3.50–10.80)

## 2015-02-14 LAB — HEMOLYSIS INDEX: Hemolysis Index: 6 (ref 0–18)

## 2015-02-14 LAB — GFR: EGFR: >60

## 2015-02-14 LAB — CK: Creatine Kinase (CK): 157 U/L (ref 47–267)

## 2015-02-14 NOTE — Progress Notes (Signed)
Dean Nelson is a 34 y.o. male here for     Chief Complaint   Patient presents with   . Dizziness     Pt states feels different than anxiety. when pt stands and walking around feels dizziness    . Fatigue         HPI  Not feeling well  4 nights ago  Started feeling weak  Feverish, took tylenol  Next day felt better but had some stomach discomfort with diarrhea x 2- loose watery stool (since resolved)  Yesterday - felt under the weather but no further diarrhea - had soft BM, no vomiting  This morning - stomach better, but feels weak muscles, achy muscles and feels lightheaded, woozy    Took advil for last 2 days    New 72 week old son at home but getting 6 hours of sleep  No change in diet but did have homemade ceviche 5 days ago - no one else got sick though    ROS:  Constitutional: Denies any fever, chills, weight loss  Eyes: Denis blurry vision, double vision  HENT: Denies chronic headache, coryza, sore throat  Cardiovascular: Denies chest pain, shortness of breath, palpitations  Respiratory: Denies cough, shortness of breath  Gastrointestinal: Denies abd pain,constipation, nausea, vomiting, blood in the stool  GU: Denies dysuria, nocturia, hematuria    All other systems reviewed and is negative    No past medical history on file.    Past Surgical History   Procedure Laterality Date   . Hernia repair         Family History   Problem Relation Age of Onset   . Cancer Maternal Grandmother    . Hepatitis Paternal Grandmother         Social History   Substance Use Topics   . Smoking status: Never Smoker    . Smokeless tobacco: Never Used   . Alcohol Use: 0.0 oz/week     0 Standard drinks or equivalent per week       Current Outpatient Prescriptions on File Prior to Visit   Medication Sig Dispense Refill   . cetirizine (ZYRTEC) 10 MG tablet Take 10 mg by mouth daily.     . cyclobenzaprine (FLEXERIL) 10 MG tablet Take 1 tablet (10 mg total) by mouth 2 (two) times daily as needed for Muscle spasms. 30 tablet 1   .  rosuvastatin (CRESTOR) 5 MG tablet Take 1 tablet (5 mg total) by mouth daily. 90 tablet 3     No current facility-administered medications on file prior to visit.       Allergies   Allergen Reactions   . Ceclor [Cefaclor]    . Shellfish-Derived Products         Physical Exam:  Filed Vitals:    02/14/15 1043   BP: 123/74   Pulse: 90   Temp: 99.3 F (37.4 C)   TempSrc: Oral   Height: 1.702 m (5\' 7" )   Weight: 82.101 kg (181 lb)      Body mass index is 28.34 kg/(m^2).      Constitutional: well nourished, well developed male in no acute distress  Eyes: pupils round and reactive to light bilaterally, extraocular movements intact bilaterally, conjunctiva clear bilaterally  ENT: sinuses nontender, ear canals clean and tympanic membranes intact bilaterally, hearing grossly intact, nasal membranes nonerythematous bilaterally, oropharynx pink and moist, throat nonerythematous and clear of exudate  Neck: supple with no limited range of motion, thyroid normal sized  Card: S1S2,  RRR, no murmurs noted  Pulm: clear to auscultation bilaterally, no wheezes or rhonchi noted, good respiratory effort with no use of accessory muscles  GI: nontender, nondistended, no masses noted.  Lymph: no enlarged submandibular or cervical nodes noted  Neuro: Awake and alert, oriented x 3  Psych: normal affect with normal insight, nonanxious      Labs reviewed      Assessment and Plan:    1. Myalgia    - CBC and differential  - Comprehensive metabolic panel  - Creatine Kinase (CK)    2. Fever, unspecified fever cause  Discussed likely viral, lightheadedness likely due to dehydration. Encouraged hydration  To call if no improvement  - CBC and differential  - Comprehensive metabolic panel  - Creatine Kinase (CK)      Defers flu vaccine currently since not feeling well  Will make appt with PCP for cholesterol check and flu vaccine

## 2015-02-14 NOTE — Progress Notes (Signed)
1. Have you self referred yourself since we last saw you? "No"

## 2015-02-15 ENCOUNTER — Encounter (INDEPENDENT_AMBULATORY_CARE_PROVIDER_SITE_OTHER): Payer: Self-pay | Admitting: Internal Medicine

## 2015-02-19 ENCOUNTER — Encounter (INDEPENDENT_AMBULATORY_CARE_PROVIDER_SITE_OTHER): Payer: Self-pay | Admitting: Geriatric Medicine

## 2015-02-21 ENCOUNTER — Encounter (INDEPENDENT_AMBULATORY_CARE_PROVIDER_SITE_OTHER): Payer: Self-pay | Admitting: Internal Medicine

## 2015-02-21 ENCOUNTER — Ambulatory Visit (INDEPENDENT_AMBULATORY_CARE_PROVIDER_SITE_OTHER): Payer: Commercial Managed Care - POS | Admitting: Internal Medicine

## 2015-02-21 ENCOUNTER — Ambulatory Visit (INDEPENDENT_AMBULATORY_CARE_PROVIDER_SITE_OTHER): Payer: Commercial Managed Care - POS | Admitting: Geriatric Medicine

## 2015-02-21 VITALS — BP 117/69 | HR 69 | Temp 96.3°F | Resp 14 | Ht 67.0 in | Wt 179.6 lb

## 2015-02-21 DIAGNOSIS — Z23 Encounter for immunization: Secondary | ICD-10-CM

## 2015-02-21 DIAGNOSIS — E782 Mixed hyperlipidemia: Secondary | ICD-10-CM

## 2015-02-21 LAB — LIPID PANEL
Cholesterol / HDL Ratio: 4.7
Cholesterol: 197 mg/dL (ref 0–199)
HDL: 42 mg/dL (ref 40–9999)
LDL Calculated: 135 mg/dL — ABNORMAL HIGH (ref 0–99)
Triglycerides: 101 mg/dL (ref 34–149)
VLDL Calculated: 20 mg/dL (ref 10–40)

## 2015-02-21 LAB — HEMOLYSIS INDEX: Hemolysis Index: 7 (ref 0–18)

## 2015-02-21 NOTE — Progress Notes (Signed)
1. Have you self referred yourself since we last saw you?    Refer to care team   Or  Add specialists:    Yes - [place] for sinus infection

## 2015-02-21 NOTE — Progress Notes (Signed)
PROGRESS NOTE    Date Time: 02/21/2015   Patient Name: Dean Nelson, Dean Nelson      Chief Complaint:     Chief Complaint   Patient presents with   . Hyperlipidemia     Pt needs cholesterol levels re-checked after taking Crestor.   . Flu Vaccine        History of Present Illness:   Pt presents for hyperlipidemia follow up. During last visit with Dr. Waunita Schooner patient was started on low dose crestor 5 mg, which he tolerates fine - no muscle pain or weakness/fatigue. Patient has FH of CAD (his father has h/o MI), so he is motivated to manage his cholesterol aggressively. His diet and lifestyle are healthy.    Patient also requests flu shot today.    Review of Systems:   ROS    General: denies weakness, fatigue.  HEENT: denies congestion, sore throat, or ear pain.  Respiratory: denies cough, wheezing, dyspnea.  CV: denies chest pain, palpitations, edema, syncopal events.       Problem List:     Patient Active Problem List   Diagnosis   . Onychomycosis   . HLD (hyperlipidemia)   . Neck ache       Medications:     Current Outpatient Prescriptions   Medication Sig Dispense Refill   . cetirizine (ZYRTEC) 10 MG tablet Take 10 mg by mouth daily.     . rosuvastatin (CRESTOR) 5 MG tablet Take 1 tablet (5 mg total) by mouth daily. 90 tablet 3   . cyclobenzaprine (FLEXERIL) 10 MG tablet Take 1 tablet (10 mg total) by mouth 2 (two) times daily as needed for Muscle spasms. 30 tablet 1     No current facility-administered medications for this visit.         Allergies:     Allergies   Allergen Reactions   . Ceclor [Cefaclor]    . Shellfish-Derived Products          History:   History reviewed. No pertinent past medical history.  Past Surgical History   Procedure Laterality Date   . Hernia repair       Family History   Problem Relation Age of Onset   . Cancer Maternal Grandmother    . Hepatitis Paternal Grandmother    . Coronary artery disease Father      Had an MI     Social History     Social History   . Marital Status: Single     Spouse  Name: N/A   . Number of Children: N/A   . Years of Education: N/A     Occupational History   . Not on file.     Social History Main Topics   . Smoking status: Never Smoker    . Smokeless tobacco: Never Used   . Alcohol Use: 0.0 oz/week     0 Standard drinks or equivalent per week   . Drug Use: No   . Sexual Activity:     Partners: Female     Other Topics Concern   . Not on file     Social History Narrative         Physical exam:     Filed Vitals:    02/21/15 1034   BP: 117/69   Pulse: 69   Temp: 96.3 F (35.7 C)   Resp: 14   SpO2: 99%     Gen - in no acute distress  Resp - good respiratory effort, chest clear to auscultation  CV - regular rate and rhythm, no murmurs or gallops, intact peripheral pulses.  Psych - alert and oritented x 3, normal affect.    Orders:        Orders Placed This Encounter   Procedures   . Lipid panel     Order Specific Question:  Has the patient fasted?     Answer:  Yes   . Hemolysis index     Has the patient fasted?->Yes       Assessment and Plan:     Encounter Diagnoses   Name Primary?   . Mixed hyperlipidemia - patients recent labs reviewed. His LFTs were done a week ago during sick visit to another [facility] clinic; his LFTs were normal. Lipid profile is ordered. Potential side effects of statin again discussed w/ pt  - he has none at this time. Yes   . Influenza vaccine needed - flu shot administered today.

## 2015-02-21 NOTE — Progress Notes (Signed)
Administered Fluzone vaccine to LT deltoid.  Patient tolerated well with no adverse reaction noted.  Patient waited the reccommended 15 minutes after immunization / vaccination.

## 2015-02-25 ENCOUNTER — Encounter (INDEPENDENT_AMBULATORY_CARE_PROVIDER_SITE_OTHER): Payer: Self-pay | Admitting: Geriatric Medicine

## 2015-07-18 ENCOUNTER — Encounter (INDEPENDENT_AMBULATORY_CARE_PROVIDER_SITE_OTHER): Payer: Self-pay | Admitting: Geriatric Medicine

## 2015-07-18 ENCOUNTER — Ambulatory Visit (INDEPENDENT_AMBULATORY_CARE_PROVIDER_SITE_OTHER): Payer: Commercial Managed Care - POS | Admitting: Geriatric Medicine

## 2015-07-18 VITALS — BP 138/78 | HR 57 | Temp 97.0°F | Resp 20 | Ht 67.0 in | Wt 182.0 lb

## 2015-07-18 DIAGNOSIS — Z0000 Encounter for general adult medical examination without abnormal findings: Secondary | ICD-10-CM

## 2015-07-18 LAB — COMPREHENSIVE METABOLIC PANEL
ALT: 31 U/L (ref 0–55)
AST (SGOT): 25 U/L (ref 5–34)
Albumin/Globulin Ratio: 1.7 (ref 0.9–2.2)
Albumin: 4.5 g/dL (ref 3.5–5.0)
Alkaline Phosphatase: 78 U/L (ref 38–106)
BUN: 9 mg/dL (ref 9.0–28.0)
Bilirubin, Total: 0.5 mg/dL (ref 0.1–1.2)
CO2: 30 mEq/L (ref 21–30)
Calcium: 10 mg/dL (ref 8.5–10.5)
Chloride: 103 mEq/L (ref 100–111)
Creatinine: 0.9 mg/dL (ref 0.5–1.5)
Globulin: 2.7 g/dL (ref 2.0–3.7)
Glucose: 81 mg/dL (ref 70–100)
Potassium: 4 mEq/L (ref 3.5–5.3)
Protein, Total: 7.2 g/dL (ref 6.0–8.3)
Sodium: 141 mEq/L (ref 135–146)

## 2015-07-18 LAB — LIPID PANEL
Cholesterol / HDL Ratio: 4.2
Cholesterol: 179 mg/dL (ref 0–199)
HDL: 43 mg/dL (ref 40–9999)
LDL Calculated: 118 mg/dL — ABNORMAL HIGH (ref 0–99)
Triglycerides: 89 mg/dL (ref 34–149)
VLDL Calculated: 18 mg/dL (ref 10–40)

## 2015-07-18 LAB — URINALYSIS
Bilirubin, UA: NEGATIVE
Glucose, UA: NEGATIVE
Ketones UA: NEGATIVE
Leukocyte Esterase, UA: NEGATIVE
Nitrite, UA: NEGATIVE
Protein, UR: NEGATIVE
Specific Gravity UA: 1.009 (ref 1.001–1.035)
Urine pH: 7.5 (ref 5.0–8.0)
Urobilinogen, UA: 0.2 (ref 0.2–2.0)

## 2015-07-18 LAB — CBC AND DIFFERENTIAL
Basophils Absolute Automated: 0.05 10*3/uL (ref 0.00–0.20)
Basophils Automated: 1 %
Eosinophils Absolute Automated: 0.16 10*3/uL (ref 0.00–0.70)
Eosinophils Automated: 3 %
Hematocrit: 44.4 % (ref 42.0–52.0)
Hgb: 15 g/dL (ref 13.0–17.0)
Immature Granulocytes Absolute: 0.04 10*3/uL
Immature Granulocytes: 1 %
Lymphocytes Absolute Automated: 2.14 10*3/uL (ref 0.50–4.40)
Lymphocytes Automated: 38 %
MCH: 30.3 pg (ref 28.0–32.0)
MCHC: 33.8 g/dL (ref 32.0–36.0)
MCV: 89.7 fL (ref 80.0–100.0)
MPV: 10.2 fL (ref 9.4–12.3)
Monocytes Absolute Automated: 0.46 10*3/uL (ref 0.00–1.20)
Monocytes: 8 %
Neutrophils Absolute: 2.85 10*3/uL (ref 1.80–8.10)
Neutrophils: 50 %
Nucleated RBC: 0 /100 WBC (ref 0–1)
Platelets: 278 10*3/uL (ref 140–400)
RBC: 4.95 10*6/uL (ref 4.70–6.00)
RDW: 13 % (ref 12–15)
WBC: 5.7 10*3/uL (ref 3.50–10.80)

## 2015-07-18 LAB — URINE MICROSCOPIC

## 2015-07-18 LAB — GFR: EGFR: >60

## 2015-07-18 LAB — HEMOLYSIS INDEX: Hemolysis Index: 15 (ref 0–18)

## 2015-07-18 LAB — TSH: TSH: 0.91 u[IU]/mL (ref 0.35–4.94)

## 2015-07-18 LAB — VITAMIN D,25 OH,TOTAL: Vitamin D, 25 OH, Total: 28 ng/mL — ABNORMAL LOW (ref 30–100)

## 2015-07-18 MED ORDER — EFINACONAZOLE 10 % EX SOLN
1.0000 | Freq: Every day | CUTANEOUS | Status: AC
Start: 2015-07-18 — End: 2016-07-17

## 2015-07-18 NOTE — Patient Instructions (Signed)
See me once yearly for annual preventative visit.  This is different and separate from any problem-related visit.      Get a flu vaccine yearly in the fall.  Increase vegetables, fruits, and fiber in your diet.  Exercise at least 30 minutes 5 days per week.  Always wear your seatbelt in the car.  Wear sunscreen that is broad-spectrum (UVA/UVB) and at least SPF 30.  Avoid heavy drinking (>14 drinks per week).  Goal Blood Pressure <140/90  Schedule dental exam and cleaning every 6 months  Have your vision checked every 1-2 years.    Next colonoscopy due  Age 35  The best website for medical information is called UpToDate.  It's free for patients.  SeekStrategy.tn  [facility] website also has good information.  TanExchange.nl    male

## 2015-07-18 NOTE — Progress Notes (Signed)
Have you seen any new specialists/physicians since you were last here?  Yes:  Dermatologist        Limb alert protocol reviewed?  Yes or No  Yes

## 2015-07-18 NOTE — Progress Notes (Signed)
Subjective:       Patient ID: Dean Nelson is a 35 y.o. male.    HPIPt presents for a physical. Has a 6 mo son. Dental Needs to sched.  Derm- UTD    The following portions of the patient's history were reviewed and updated as appropriate: allergies, current medications, past family history, past medical history, past social history, past surgical history and problem list.  No past medical history on file.    Social History     Social History   . Marital Status: Single     Spouse Name: N/A   . Number of Children: N/A   . Years of Education: N/A     Occupational History   . Not on file.     Social History Main Topics   . Smoking status: Never Smoker    . Smokeless tobacco: Never Used   . Alcohol Use: 0.0 oz/week     0 Standard drinks or equivalent per week   . Drug Use: No   . Sexual Activity:     Partners: Female     Other Topics Concern   . Not on file     Social History Narrative       Current Outpatient Prescriptions   Medication Sig Dispense Refill   . cetirizine (ZYRTEC) 10 MG tablet Take 10 mg by mouth daily.     . cyclobenzaprine (FLEXERIL) 10 MG tablet Take 1 tablet (10 mg total) by mouth 2 (two) times daily as needed for Muscle spasms. 30 tablet 1   . rosuvastatin (CRESTOR) 5 MG tablet Take 1 tablet (5 mg total) by mouth daily. 90 tablet 3     No current facility-administered medications for this visit.       Allergies   Allergen Reactions   . Ceclor [Cefaclor]    . Shellfish-Derived Products        Past Surgical History   Procedure Laterality Date   . Hernia repair         Family History   Problem Relation Age of Onset   . Cancer Maternal Grandmother    . Hepatitis Paternal Grandmother    . Coronary artery disease Father      Had an MI       Review of Systems   Constitutional: Negative for fever, activity change, appetite change, fatigue and unexpected weight change.   HENT: Negative for congestion, hearing loss, postnasal drip, sore throat, tinnitus, trouble swallowing and voice change.    Eyes:  Negative for photophobia and visual disturbance.   Respiratory: Negative for cough, chest tightness, shortness of breath and wheezing.    Cardiovascular: Negative for chest pain, palpitations and leg swelling.   Gastrointestinal: Negative for nausea, vomiting, abdominal pain, diarrhea, constipation, blood in stool and abdominal distention.   Genitourinary: Negative for dysuria, urgency, frequency, flank pain, discharge, penile swelling, scrotal swelling, difficulty urinating, penile pain and testicular pain.   Musculoskeletal: Negative for myalgias, back pain, joint swelling, arthralgias and neck pain.   Neurological: Negative for dizziness, weakness, numbness and headaches.   Hematological: Does not bruise/bleed easily.   Psychiatric/Behavioral: Negative for confusion, sleep disturbance, dysphoric mood and decreased concentration. The patient is not nervous/anxious.            Objective:    Physical Exam   Constitutional: He is oriented to person, place, and time. He appears well-developed and well-nourished. No distress.   HENT:   Head: Normocephalic.   Right Ear: External ear normal.  Left Ear: External ear normal.   Mouth/Throat: Oropharynx is clear and moist. No oropharyngeal exudate.   Eyes: Conjunctivae and EOM are normal. Pupils are equal, round, and reactive to light. Right eye exhibits no discharge. Left eye exhibits no discharge.   Neck: Normal range of motion. Neck supple. No thyromegaly present.   Cardiovascular: Normal rate, regular rhythm, normal heart sounds and intact distal pulses.  Exam reveals no gallop and no friction rub.    No murmur heard.  Pulmonary/Chest: Effort normal and breath sounds normal. No respiratory distress. He has no wheezes.   Abdominal: Soft. Bowel sounds are normal. He exhibits no distension and no mass. There is no tenderness. Hernia confirmed negative in the right inguinal area and confirmed negative in the left inguinal area.   Genitourinary: Rectum normal and testes  normal. Right testis shows no mass and no tenderness. Left testis shows no mass and no tenderness. Circumcised.   Musculoskeletal: Normal range of motion. He exhibits no edema or tenderness.   Lymphadenopathy:     He has no cervical adenopathy.        Right: No inguinal adenopathy present.        Left: No inguinal adenopathy present.   Neurological: He is alert and oriented to person, place, and time. He has normal reflexes. No cranial nerve deficit. He exhibits normal muscle tone. Coordination normal.   Skin: Skin is warm and dry. No rash noted.   Psychiatric: He has a normal mood and affect.     Filed Vitals:    07/18/15 1005   BP: 133/78   Pulse: 57   Temp: 97 F (36.1 C)   Resp: 20   SpO2: 98%         Assessment:       1. Annual physical exam  CBC and differential    Comprehensive metabolic panel    Lipid panel    Urinalysis    Vitamin D,25 OH, Total    TSH            Plan:      Procedures    Nutrition,, physical activity, healthy weight, , alcohol Sexual behavior and STD's,, dental and mental health,, immunizations, screening-d/w pt

## 2015-07-19 ENCOUNTER — Other Ambulatory Visit (INDEPENDENT_AMBULATORY_CARE_PROVIDER_SITE_OTHER): Payer: Self-pay | Admitting: Geriatric Medicine

## 2015-07-22 ENCOUNTER — Encounter (INDEPENDENT_AMBULATORY_CARE_PROVIDER_SITE_OTHER): Payer: Self-pay | Admitting: Geriatric Medicine

## 2016-03-02 ENCOUNTER — Other Ambulatory Visit (INDEPENDENT_AMBULATORY_CARE_PROVIDER_SITE_OTHER): Payer: Self-pay

## 2016-03-02 MED ORDER — ROSUVASTATIN CALCIUM 5 MG PO TABS
5.0000 mg | ORAL_TABLET | Freq: Every day | ORAL | 3 refills | Status: DC
Start: 2016-03-02 — End: 2017-01-04

## 2017-01-04 ENCOUNTER — Ambulatory Visit (FREE_STANDING_LABORATORY_FACILITY): Payer: Commercial Managed Care - POS | Admitting: Geriatric Medicine

## 2017-01-04 VITALS — BP 111/60 | HR 59 | Temp 96.4°F | Resp 18 | Ht 67.0 in | Wt 184.0 lb

## 2017-01-04 DIAGNOSIS — Z0000 Encounter for general adult medical examination without abnormal findings: Secondary | ICD-10-CM

## 2017-01-04 DIAGNOSIS — M62838 Other muscle spasm: Secondary | ICD-10-CM

## 2017-01-04 DIAGNOSIS — E785 Hyperlipidemia, unspecified: Secondary | ICD-10-CM

## 2017-01-04 DIAGNOSIS — Z1283 Encounter for screening for malignant neoplasm of skin: Secondary | ICD-10-CM

## 2017-01-04 LAB — COMPREHENSIVE METABOLIC PANEL
ALT: 15 U/L (ref 0–55)
AST (SGOT): 21 U/L (ref 5–34)
Albumin/Globulin Ratio: 1.6 (ref 0.9–2.2)
Albumin: 4.4 g/dL (ref 3.5–5.0)
Alkaline Phosphatase: 71 U/L (ref 38–106)
BUN: 11 mg/dL (ref 9.0–28.0)
Bilirubin, Total: 0.7 mg/dL (ref 0.1–1.2)
CO2: 28 mEq/L (ref 21–29)
Calcium: 10.1 mg/dL (ref 8.5–10.5)
Chloride: 104 mEq/L (ref 100–111)
Creatinine: 0.9 mg/dL (ref 0.5–1.5)
Globulin: 2.8 g/dL (ref 2.0–3.7)
Glucose: 85 mg/dL (ref 70–100)
Potassium: 3.8 mEq/L (ref 3.5–5.1)
Protein, Total: 7.2 g/dL (ref 6.0–8.3)
Sodium: 141 mEq/L (ref 136–145)

## 2017-01-04 LAB — CBC AND DIFFERENTIAL
Absolute NRBC: 0 10*3/uL
Basophils Absolute Automated: 0.07 10*3/uL (ref 0.00–0.20)
Basophils Automated: 1.3 %
Eosinophils Absolute Automated: 0.39 10*3/uL (ref 0.00–0.70)
Eosinophils Automated: 7.1 %
Hematocrit: 44.6 % (ref 42.0–52.0)
Hgb: 15 g/dL (ref 13.0–17.0)
Immature Granulocytes Absolute: 0.03 10*3/uL
Immature Granulocytes: 0.5 %
Lymphocytes Absolute Automated: 1.8 10*3/uL (ref 0.50–4.40)
Lymphocytes Automated: 32.7 %
MCH: 29.4 pg (ref 28.0–32.0)
MCHC: 33.6 g/dL (ref 32.0–36.0)
MCV: 87.3 fL (ref 80.0–100.0)
MPV: 9.7 fL (ref 9.4–12.3)
Monocytes Absolute Automated: 0.42 10*3/uL (ref 0.00–1.20)
Monocytes: 7.6 %
Neutrophils Absolute: 2.79 10*3/uL (ref 1.80–8.10)
Neutrophils: 50.8 %
Nucleated RBC: 0 /100 WBC (ref 0.0–1.0)
Platelets: 272 10*3/uL (ref 140–400)
RBC: 5.11 10*6/uL (ref 4.70–6.00)
RDW: 12 % (ref 12–15)
WBC: 5.5 10*3/uL (ref 3.50–10.80)

## 2017-01-04 LAB — URINALYSIS
Bilirubin, UA: NEGATIVE
Glucose, UA: NEGATIVE
Ketones UA: NEGATIVE
Leukocyte Esterase, UA: NEGATIVE
Nitrite, UA: NEGATIVE
Protein, UR: NEGATIVE
Specific Gravity UA: 1.007 (ref 1.001–1.035)
Urine pH: 7 (ref 5.0–8.0)
Urobilinogen, UA: 0.2

## 2017-01-04 LAB — TSH: TSH: 1.1 u[IU]/mL (ref 0.35–4.94)

## 2017-01-04 LAB — LIPID PANEL
Cholesterol / HDL Ratio: 3.8
Cholesterol: 177 mg/dL (ref 0–199)
HDL: 46 mg/dL (ref 40–9999)
LDL Calculated: 102 mg/dL — ABNORMAL HIGH (ref 0–99)
Triglycerides: 143 mg/dL (ref 34–149)
VLDL Calculated: 29 mg/dL (ref 10–40)

## 2017-01-04 LAB — HEMOGLOBIN A1C
Average Estimated Glucose: 91.1 mg/dL
Hemoglobin A1C: 4.8 % (ref 4.6–5.9)

## 2017-01-04 LAB — URINE MICROSCOPIC

## 2017-01-04 LAB — VITAMIN D,25 OH,TOTAL: Vitamin D, 25 OH, Total: 36 ng/mL (ref 30–100)

## 2017-01-04 LAB — HEMOLYSIS INDEX: Hemolysis Index: 6 (ref 0–18)

## 2017-01-04 LAB — GFR: EGFR: >60

## 2017-01-04 MED ORDER — ROSUVASTATIN CALCIUM 5 MG PO TABS
5.0000 mg | ORAL_TABLET | Freq: Every day | ORAL | 3 refills | Status: DC
Start: 2017-01-04 — End: 2017-07-28

## 2017-01-04 MED ORDER — CYCLOBENZAPRINE HCL 10 MG PO TABS
10.0000 mg | ORAL_TABLET | Freq: Two times a day (BID) | ORAL | 1 refills | Status: DC | PRN
Start: 2017-01-04 — End: 2017-08-12

## 2017-01-04 NOTE — Progress Notes (Signed)
Subjective:      Patient ID: Dean Nelson is a 36 y.o. male.    Chief Complaint:  Chief Complaint   Patient presents with   . Annual Exam     Pt is fasting this morning   . Medication Refill     Pt needs refill of both meds:  Send to [facility]       HPI:Pt  presents for a physical exam. Started a new job. Traveling a lot more. Doing exercise, but not as much as he would like. Dental - needs appt. Derm- needs appt. Lasik-  10/17. He has a h/o HLD is on Crestor 5 mg daily.  The following were updated and reviewed with the patient; ALLERGIES, current medications, past medical and surgical history, family and social history.  HPI    Problem List:  Patient Active Problem List   Diagnosis   . Onychomycosis   . HLD (hyperlipidemia)   . Neck ache       Current Medications:  Current Outpatient Prescriptions   Medication Sig Dispense Refill   . cetirizine (ZYRTEC) 10 MG tablet Take 10 mg by mouth daily.     . ciclopirox (PENLAC) 8 % solution APPLY DAILY TO AFFECTED NAILS AND ONCE A WEEK REMOVE WITH NAIL POLISH REMOVER OR ALCOHOL  2   . cyclobenzaprine (FLEXERIL) 10 MG tablet Take 1 tablet (10 mg total) by mouth 2 (two) times daily as needed for Muscle spasms. 30 tablet 1   . rosuvastatin (CRESTOR) 5 MG tablet Take 1 tablet (5 mg total) by mouth daily. 90 tablet 3     No current facility-administered medications for this visit.        Allergies:  Allergies   Allergen Reactions   . Ceclor [Cefaclor]    . Shellfish-Derived Products        Past Medical History:  No past medical history on file.    Past Surgical History:  Past Surgical History:   Procedure Laterality Date   . HERNIA REPAIR         Family History:  Family History   Problem Relation Age of Onset   . Cancer Maternal Grandmother    . Hepatitis Paternal Grandmother    . Coronary artery disease Father         Had an MI       Social History:  Social History     Social History   . Marital status: Single     Spouse name: N/A   . Number of children: N/A   .  Years of education: N/A     Occupational History   . Not on file.     Social History Main Topics   . Smoking status: Never Smoker   . Smokeless tobacco: Never Used   . Alcohol use 0.0 oz/week   . Drug use: No   . Sexual activity: Yes     Partners: Female     Other Topics Concern   . Not on file     Social History Narrative   . No narrative on file       The following sections were reviewed this encounter by the provider:        ROS:  Review of Systems   Constitutional: Negative for activity change, appetite change, fatigue, fever and unexpected weight change.   HENT: Negative for congestion, hearing loss, postnasal drip, sore throat, tinnitus, trouble swallowing and voice change.    Eyes: Negative for photophobia and  visual disturbance.   Respiratory: Negative for cough, chest tightness, shortness of breath and wheezing.    Cardiovascular: Negative for chest pain, palpitations and leg swelling.   Gastrointestinal: Negative for abdominal distention, abdominal pain, blood in stool, constipation, diarrhea, nausea and vomiting.   Genitourinary: Negative for difficulty urinating, dysuria, flank pain, frequency and urgency.   Musculoskeletal: Negative for arthralgias, back pain, joint swelling, myalgias and neck pain.   Neurological: Negative for dizziness, weakness, numbness and headaches.   Hematological: Does not bruise/bleed easily.   Psychiatric/Behavioral: Negative for confusion, decreased concentration, dysphoric mood and sleep disturbance. The patient is not nervous/anxious.        Vitals:  BP 111/60 (BP Site: Right arm, Patient Position: Sitting, Cuff Size: Large) Comment: .  Pulse (!) 59   Temp (!) 96.4 F (35.8 C) (Tympanic)   Resp 18   Ht 1.702 m (5\' 7" )   Wt 83.5 kg (184 lb)   SpO2 98%   BMI 28.82 kg/m      Objective:     Physical Exam:  Physical Exam   Constitutional: He is oriented to person, place, and time. He appears well-developed and well-nourished. No distress.   HENT:   Head: Normocephalic.    Right Ear: External ear normal.   Left Ear: External ear normal.   Mouth/Throat: Oropharynx is clear and moist. No oropharyngeal exudate.   Eyes: Pupils are equal, round, and reactive to light. Conjunctivae and EOM are normal. Right eye exhibits no discharge. Left eye exhibits no discharge.   Neck: Normal range of motion. Neck supple. No thyromegaly present.   Cardiovascular: Normal rate, regular rhythm, normal heart sounds and intact distal pulses.  Exam reveals no gallop and no friction rub.    No murmur heard.  Pulmonary/Chest: Effort normal and breath sounds normal. No respiratory distress. He has no wheezes.   Abdominal: Soft. Bowel sounds are normal. He exhibits no distension and no mass. There is no tenderness. Hernia confirmed negative in the right inguinal area and confirmed negative in the left inguinal area.   Genitourinary: Testes normal and penis normal. Circumcised.   Musculoskeletal: Normal range of motion. He exhibits no edema or tenderness.   Lymphadenopathy:     He has no cervical adenopathy. No inguinal adenopathy noted on the right or left side.        Right: No inguinal adenopathy present.        Left: No inguinal adenopathy present.   Neurological: He is alert and oriented to person, place, and time. He has normal reflexes. He displays normal reflexes. No cranial nerve deficit. He exhibits normal muscle tone. Coordination normal.   Skin: Skin is warm and dry. No rash noted.   Psychiatric: He has a normal mood and affect.   Nursing note and vitals reviewed.       Assessment:     1. Annual physical exam  CBC and differential    Comprehensive metabolic panel    Lipid panel    TSH    Urinalysis    Vitamin D,25 OH, Total    Hemoglobin A1C   2. Hyperlipidemia, unspecified hyperlipidemia type  rosuvastatin (CRESTOR) 5 MG tablet   3. Muscle spasm  cyclobenzaprine (FLEXERIL) 10 MG tablet       Plan:     Nutrition, Contraception, physical activity, healthy weight,Sexual behavior and STD's,, dental and  mental health,, immunizations, screening-d/w  pt      Dontee Jaso Emeline Gins, MD

## 2017-01-04 NOTE — Patient Instructions (Addendum)
See me once yearly for annual preventative visit.  This is different and separate from any problem-related visit.      Get a flu vaccine yearly in the fall.  Increase vegetables, fruits, and fiber in your diet.  Exercise at least 30 minutes 5 days per week.  Always wear your seatbelt in the car.  Wear sunscreen that is broad-spectrum (UVA/UVB) and at least SPF 30.  Avoid heavy drinking (>14 drinks per week).  Goal Blood Pressure <140/90  Schedule dental exam and cleaning every 6 months  Have your vision checked every 1-2 years.    Next colonoscopy due age 52    The best website for medical information is called UpToDate.  It's free for patients.  [email or web address]  The [facility] website also has good information.  [email or web address]

## 2017-01-04 NOTE — Progress Notes (Signed)
Have you seen any specialists/other providers since your last visit with us?      No      Arm preference verified?     Yes    The patient is due for depression screening

## 2017-01-12 ENCOUNTER — Encounter (INDEPENDENT_AMBULATORY_CARE_PROVIDER_SITE_OTHER): Payer: Self-pay | Admitting: Geriatric Medicine

## 2017-01-16 ENCOUNTER — Other Ambulatory Visit (INDEPENDENT_AMBULATORY_CARE_PROVIDER_SITE_OTHER): Payer: Self-pay | Admitting: Geriatric Medicine

## 2017-01-16 DIAGNOSIS — R3129 Other microscopic hematuria: Secondary | ICD-10-CM

## 2017-01-18 ENCOUNTER — Telehealth (INDEPENDENT_AMBULATORY_CARE_PROVIDER_SITE_OTHER): Payer: Self-pay | Admitting: Geriatric Medicine

## 2017-01-18 NOTE — Telephone Encounter (Signed)
Mailed pt Urology Referral

## 2017-02-25 ENCOUNTER — Ambulatory Visit (INDEPENDENT_AMBULATORY_CARE_PROVIDER_SITE_OTHER): Payer: Commercial Managed Care - POS | Admitting: Internal Medicine

## 2017-02-25 ENCOUNTER — Encounter (INDEPENDENT_AMBULATORY_CARE_PROVIDER_SITE_OTHER): Payer: Self-pay | Admitting: Internal Medicine

## 2017-02-25 VITALS — BP 138/77 | HR 78 | Temp 97.8°F | Resp 17 | Ht 67.0 in | Wt 185.8 lb

## 2017-02-25 DIAGNOSIS — Z113 Encounter for screening for infections with a predominantly sexual mode of transmission: Secondary | ICD-10-CM

## 2017-02-25 DIAGNOSIS — Z23 Encounter for immunization: Secondary | ICD-10-CM

## 2017-02-25 LAB — HEPATITIS C ANTIBODY: Hepatitis C, AB: NONREACTIVE

## 2017-02-25 LAB — HEPATITIS B SURFACE ANTIGEN W/ REFLEX TO CONFIRMATION: Hepatitis B Surface Antigen: NONREACTIVE

## 2017-02-25 LAB — HEMOLYSIS INDEX: Hemolysis Index: 5 (ref 0–18)

## 2017-02-25 LAB — HIV AG/AB 4TH GENERATION: HIV Ag/Ab, 4th Generation: NONREACTIVE

## 2017-02-25 NOTE — Progress Notes (Signed)
Have you seen any specialists/other providers since your last visit with us?    Yes podiatry   Arm preference verified?   Yes     The patient is due for influenza vaccine

## 2017-02-25 NOTE — Progress Notes (Signed)
PROGRESS NOTE    Date Time: 02/25/2017   Patient Name: Dean Nelson, Dean Nelson      Chief Complaint:     Chief Complaint   Patient presents with   . Exposure to STD   . Flu Vaccine        History of Present Illness:   Pt is here for an STD screen.  Pt's wife has type 1 HSV on her genitals. They have been married for 5 yrs - no protection with condom.  His wife and pt have no sxs.  Pt had a skin growth on his penis; the growth was removed by dermatologist. It is not conclusive if the growth was a wart. Now the growth is back - on the same location.    Review of Systems:   ROS  As per HPI.    Problem List:     Patient Active Problem List   Diagnosis   . Onychomycosis   . HLD (hyperlipidemia)   . Neck ache       Medications:     Current Outpatient Prescriptions   Medication Sig Dispense Refill   . cetirizine (ZYRTEC) 10 MG tablet Take 10 mg by mouth daily.     . ciclopirox (PENLAC) 8 % solution APPLY DAILY TO AFFECTED NAILS AND ONCE A WEEK REMOVE WITH NAIL POLISH REMOVER OR ALCOHOL  2   . cyclobenzaprine (FLEXERIL) 10 MG tablet Take 1 tablet (10 mg total) by mouth 2 (two) times daily as needed for Muscle spasms. 30 tablet 1   . rosuvastatin (CRESTOR) 5 MG tablet Take 1 tablet (5 mg total) by mouth daily. 90 tablet 3     No current facility-administered medications for this visit.          Allergies:     Allergies   Allergen Reactions   . Ceclor [Cefaclor]    . Shellfish-Derived Products          History:   History reviewed. No pertinent past medical history.  Past Surgical History:   Procedure Laterality Date   . HERNIA REPAIR       Family History   Problem Relation Age of Onset   . Cancer Maternal Grandmother    . Hepatitis Paternal Grandmother    . Coronary artery disease Father         Had an MI     Social History     Social History   . Marital status: Single     Spouse name: N/A   . Number of children: N/A   . Years of education: N/A     Occupational History   . Not on file.     Social History Main Topics   . Smoking  status: Never Smoker   . Smokeless tobacco: Never Used   . Alcohol use 0.0 oz/week   . Drug use: No   . Sexual activity: Yes     Partners: Female     Other Topics Concern   . Not on file     Social History Narrative   . No narrative on file         Physical exam:     Vitals:    02/25/17 1450   BP: 138/77   Pulse: 78   Resp: 17   Temp: 97.8 F (36.6 C)   SpO2: 97%     Body mass index is 29.1 kg/m.    Gen - pleasant male in no distress.  GU - flat skin colored growth on shaft  of penis.      Assessment and Plan:   1. Flu vaccine need  - Flu vaccine QUAD PRES FREE 30YRS & GREATER    2. Screen for STD (sexually transmitted disease)  - Hepatitis B (HBV) Surface Antigen  - Hepatitis C (HCV) antibody, Total  - HIV Ag/Ab 4th generation  - RPR (Reflex to Titer and Confirmation)  - HSV 1-2 IgM AB, IFA (S)RFLX To Titer  - HSV 1/2 Type-Specific IgG    Penile growth does not look like a wart to me, but ultimately pt needs to see dermatologist regarding this skin lesion.

## 2017-02-26 LAB — HSV TYPE 1 AND 2 ANTIBODY IGG: HSV 2 IgG Type-Specific Antibody: <0.9 (ref <0.90)

## 2017-02-26 LAB — HSV TYPE 1 AND 2 IGG: HSV 1 IgG Type-Specific AB: <0.9 (ref <0.90)

## 2017-02-26 LAB — URINE CHLAMYDIA/NEISSERIA BY PCR
Chlamydia DNA by PCR: NEGATIVE
Neisseria gonorrhoeae by PCR: NEGATIVE

## 2017-02-26 LAB — RPR (REFLEX TO TITER AND CONFIRMATION): RPR: NONREACTIVE

## 2017-02-28 LAB — HSV 1-2 IGM AB, IFA REFLEX TO TITER
HSV 1 IgM Screen: NEGATIVE
HSV 2 IgM Screen: NEGATIVE

## 2017-03-29 ENCOUNTER — Encounter (INDEPENDENT_AMBULATORY_CARE_PROVIDER_SITE_OTHER): Payer: Self-pay

## 2017-03-29 ENCOUNTER — Ambulatory Visit (INDEPENDENT_AMBULATORY_CARE_PROVIDER_SITE_OTHER): Payer: Commercial Managed Care - POS

## 2017-03-29 VITALS — BP 129/80 | HR 60 | Temp 97.7°F | Wt 190.0 lb

## 2017-03-29 DIAGNOSIS — H0100A Unspecified blepharitis right eye, upper and lower eyelids: Secondary | ICD-10-CM

## 2017-03-29 DIAGNOSIS — H0100B Unspecified blepharitis left eye, upper and lower eyelids: Secondary | ICD-10-CM

## 2017-03-29 MED ORDER — CIPROFLOXACIN HCL 500 MG PO TABS
500.0000 mg | ORAL_TABLET | Freq: Two times a day (BID) | ORAL | 0 refills | Status: AC
Start: 2017-03-29 — End: 2017-04-05

## 2017-03-29 MED ORDER — PREDNISONE 20 MG PO TABS
40.0000 mg | ORAL_TABLET | Freq: Every day | ORAL | 0 refills | Status: AC
Start: 2017-03-29 — End: 2017-04-03

## 2017-03-29 NOTE — Progress Notes (Signed)
Subjective:      Patient ID: Dean Nelson is a 36 y.o. male     Chief Complaint   Patient presents with   . Conjunctivitis     per patient exposed to pink from wife and son, noticed since last night having some swelling in both eyes.        HPI   Eye irritation.  This involves basically the lids only of both eyes though the right is worse than the left.  It started a few days ago with mild swelling that was barely noticeable.  But in the last day it evolved into increased redness and irritation as well as swelling. No pain.  No vision problems. No drainage.   No denied causes.  No stye.   But there is increased sense of pressure and swelling in the lid and he is getting ready to travel.   He [place] usually very healthy.      The following sections were reviewed this encounter by the provider:   Allergies  Meds  Problems         Review of Systems   Constitutional: Negative for activity change, fatigue and unexpected weight change.   Eyes: Positive for itching. Negative for photophobia, pain, discharge, redness and visual disturbance.        The lid below the right eye is slightly red and has some swelling     Respiratory: Negative.    Gastrointestinal: Negative.    Genitourinary: Negative.    Neurological: Negative.    Psychiatric/Behavioral: Negative.  Negative for agitation and behavioral problems.          BP 129/80   Pulse 60   Temp 97.7 F (36.5 C) (Oral)   Wt 86.2 kg (190 lb)   BMI 29.76 kg/m     Objective:     Physical Exam   Constitutional: He is oriented to person, place, and time. He appears well-developed and well-nourished. No distress.   Eyes: Pupils are equal, round, and reactive to light. EOM are normal. Right eye exhibits no discharge. Left eye exhibits no discharge. No scleral icterus.   Eye exam is normal.  But the lower lid on the right is swollen and there is slight tenderness to blink or manipulate the lied.    Neck: Normal range of motion.   Cardiovascular: Normal rate and regular  rhythm.    Pulmonary/Chest: Effort normal.   Musculoskeletal: Normal range of motion.   Neurological: He is alert and oriented to person, place, and time.   Skin: Skin is warm and dry. He is not diaphoretic. No erythema.   Psychiatric: He has a normal mood and affect. His behavior is normal.   Nursing note and vitals reviewed.       Assessment:     1. Blepharitis of upper and lower eyelids of both eyes, unspecified type  - predniSONE (DELTASONE) 20 MG tablet; Take 2 tablets (40 mg total) by mouth daily.for 5 days  Dispense: 10 tablet; Refill: 0  - ciprofloxacin (CIPRO) 500 MG tablet; Take 1 tablet (500 mg total) by mouth 2 (two) times daily.for 7 days  Dispense: 14 tablet; Refill: 0        Plan:     Eyelid irritation with bacterial blepharitis at the top of the list considering the progression aid increased level of swelling.   Also since he is leaving town it is appropriate to address.  Cover with prednisone and cipro and return if not improving.  Dean Nelson., MD

## 2017-05-20 ENCOUNTER — Telehealth (INDEPENDENT_AMBULATORY_CARE_PROVIDER_SITE_OTHER): Payer: Self-pay

## 2017-05-20 NOTE — Telephone Encounter (Signed)
Returned pt's call RE sharp pain in RT lower abdomen.  Relayed per Dr. Waunita Schooner that pt needs to go to ED for this.  Pt showed understanding of this and stated he would go.  We will follow-up in a few days to see how the pt is doing.

## 2017-05-20 NOTE — Telephone Encounter (Signed)
Pt called in reporting sharp pain in R lower abdomen region. Onset 05/19/2017 PM. Pain described as spasms rated 8/10 on numerical scale. Pain has since improved. Today pain rated 5-6/10 on numerical scale. Pt took 3 Advil for pain. Pt denies fever,chills, body aches, nausea, vomiting, changes in bowl movements.  Pt is concerned this might be appendicitis.  Can pt wait until early next week for an appt or should pt go to UC/ED.  Please advise.   CB # [phone]

## 2017-07-28 ENCOUNTER — Other Ambulatory Visit (INDEPENDENT_AMBULATORY_CARE_PROVIDER_SITE_OTHER): Payer: Self-pay | Admitting: Geriatric Medicine

## 2017-07-28 DIAGNOSIS — E785 Hyperlipidemia, unspecified: Secondary | ICD-10-CM

## 2017-07-30 MED ORDER — ROSUVASTATIN CALCIUM 5 MG PO TABS
5.0000 mg | ORAL_TABLET | Freq: Every day | ORAL | 3 refills | Status: AC
Start: 2017-07-30 — End: ?

## 2017-08-12 ENCOUNTER — Encounter (INDEPENDENT_AMBULATORY_CARE_PROVIDER_SITE_OTHER): Payer: Self-pay | Admitting: Geriatric Medicine

## 2017-08-12 ENCOUNTER — Ambulatory Visit (FREE_STANDING_LABORATORY_FACILITY): Payer: Commercial Managed Care - POS | Admitting: Geriatric Medicine

## 2017-08-12 VITALS — BP 122/73 | HR 53 | Temp 97.3°F | Resp 18 | Ht 67.0 in | Wt 183.0 lb

## 2017-08-12 DIAGNOSIS — Z0000 Encounter for general adult medical examination without abnormal findings: Secondary | ICD-10-CM

## 2017-08-12 LAB — URINALYSIS
Bilirubin, UA: NEGATIVE
Glucose, UA: NEGATIVE
Ketones UA: NEGATIVE
Leukocyte Esterase, UA: NEGATIVE
Nitrite, UA: NEGATIVE
Protein, UR: NEGATIVE
Specific Gravity UA: 1.007 (ref 1.001–1.035)
Urine pH: 6.5 (ref 5.0–8.0)
Urobilinogen, UA: 0.2

## 2017-08-12 LAB — HEMOGLOBIN A1C
Average Estimated Glucose: 96.8 mg/dL
Hemoglobin A1C: 5 % (ref 4.6–5.9)

## 2017-08-12 LAB — CBC AND DIFFERENTIAL
Absolute NRBC: 0 10*3/uL
Basophils Absolute Automated: 0.03 10*3/uL (ref 0.00–0.08)
Basophils Automated: 0.3 %
Eosinophils Absolute Automated: 0.01 10*3/uL (ref 0.00–0.44)
Eosinophils Automated: 0.1 %
Hematocrit: 44.6 % (ref 37.6–49.6)
Hgb: 15.1 g/dL (ref 12.5–17.1)
Immature Granulocytes Absolute: 0.05 10*3/uL (ref 0.00–0.07)
Immature Granulocytes: 0.5 %
Lymphocytes Absolute Automated: 1.83 10*3/uL (ref 0.42–3.22)
Lymphocytes Automated: 18.3 %
MCH: 29.3 pg (ref 25.1–33.5)
MCHC: 33.9 g/dL (ref 31.5–35.8)
MCV: 86.4 fL (ref 78.0–96.0)
MPV: 9.8 fL (ref 8.9–12.5)
Monocytes Absolute Automated: 0.5 10*3/uL (ref 0.21–0.85)
Monocytes: 5 %
Neutrophils Absolute: 7.59 10*3/uL — ABNORMAL HIGH (ref 1.10–6.33)
Neutrophils: 75.8 %
Nucleated RBC: 0 /100 WBC
Platelets: 356 10*3/uL — ABNORMAL HIGH (ref 142–346)
RBC: 5.16 10*6/uL (ref 4.20–5.90)
RDW: 13 % (ref 11–15)
WBC: 10.01 10*3/uL — ABNORMAL HIGH (ref 3.10–9.50)

## 2017-08-12 LAB — LIPID PANEL
Cholesterol / HDL Ratio: 3.4
Cholesterol: 187 mg/dL (ref 0–199)
HDL: 55 mg/dL (ref 40–9999)
LDL Calculated: 109 mg/dL — ABNORMAL HIGH (ref 0–99)
Triglycerides: 115 mg/dL (ref 34–149)
VLDL Calculated: 23 mg/dL (ref 10–40)

## 2017-08-12 LAB — URINE MICROSCOPIC

## 2017-08-12 LAB — COMPREHENSIVE METABOLIC PANEL
ALT: 20 U/L (ref 0–55)
AST (SGOT): 20 U/L (ref 5–34)
Albumin/Globulin Ratio: 1.7 (ref 0.9–2.2)
Albumin: 4.7 g/dL (ref 3.5–5.0)
Alkaline Phosphatase: 82 U/L (ref 38–106)
BUN: 9 mg/dL (ref 9.0–28.0)
Bilirubin, Total: 0.7 mg/dL (ref 0.2–1.2)
CO2: 27 mEq/L (ref 21–29)
Calcium: 10.2 mg/dL (ref 8.5–10.5)
Chloride: 104 mEq/L (ref 100–111)
Creatinine: 0.8 mg/dL (ref 0.5–1.5)
Globulin: 2.7 g/dL (ref 2.0–3.7)
Glucose: 90 mg/dL (ref 70–100)
Potassium: 4 mEq/L (ref 3.5–5.1)
Protein, Total: 7.4 g/dL (ref 6.0–8.3)
Sodium: 142 mEq/L (ref 136–145)

## 2017-08-12 LAB — VITAMIN D,25 OH,TOTAL: Vitamin D, 25 OH, Total: 20 ng/mL — ABNORMAL LOW (ref 30–100)

## 2017-08-12 LAB — TSH: TSH: 0.37 u[IU]/mL (ref 0.35–4.94)

## 2017-08-12 LAB — HEMOLYSIS INDEX: Hemolysis Index: 10 (ref 0–18)

## 2017-08-12 LAB — GFR: EGFR: >60

## 2017-08-12 NOTE — Progress Notes (Signed)
Subjective:      Patient ID: Dean Nelson is a 37 y.o. male.    Chief Complaint:  Chief Complaint   Patient presents with   . Annual Exam     Pt fasting this morning.  (Pt began taking dexamethasone (DECADRON) 0.75 MG tablet Tuesday for tooth problem).       HPI:Pt  presents for a physical. He and his family are moving to [place]  in a month. He  Has a new job. Dental- UTD    Pt is on Dexamethasone 0.75 mg every 3 h  Post tissue graft in his gum- however, still has discomfort at the site. May need a root canal.Derm-  UTD  Exercise-  regular      HPI The following were updated and reviewed with the patient; ALLERGIES, current medications, past medical and surgical history, family and social history    Problem List:  Patient Active Problem List   Diagnosis   . Onychomycosis   . HLD (hyperlipidemia)   . Neck ache   . Screen for STD (sexually transmitted disease)       Current Medications:  Current Outpatient Prescriptions   Medication Sig Dispense Refill   . cetirizine (ZYRTEC) 10 MG tablet Take 10 mg by mouth daily.     . ciclopirox (PENLAC) 8 % solution APPLY DAILY TO AFFECTED NAILS AND ONCE A WEEK REMOVE WITH NAIL POLISH REMOVER OR ALCOHOL  2   . dexamethasone (DECADRON) 0.75 MG tablet Take 0.75 mg by mouth every 3 (three) hours.         . rosuvastatin (CRESTOR) 5 MG tablet Take 1 tablet (5 mg total) by mouth daily. 90 tablet 3   . SOOLANTRA 1 % Cream        No current facility-administered medications for this visit.        Allergies:  Allergies   Allergen Reactions   . Ceclor [Cefaclor]    . Iodine    . Shellfish-Derived Products        Past Medical History:  No past medical history on file.    Past Surgical History:  Past Surgical History:   Procedure Laterality Date   . HERNIA REPAIR         Family History:  Family History   Problem Relation Age of Onset   . Cancer Maternal Grandmother    . Hepatitis Paternal Grandmother    . Coronary artery disease Father         Had an MI       Social History:  Social  History     Social History   . Marital status: Single     Spouse name: N/A   . Number of children: N/A   . Years of education: N/A     Occupational History   . Not on file.     Social History Main Topics   . Smoking status: Never Smoker   . Smokeless tobacco: Never Used   . Alcohol use 0.0 oz/week   . Drug use: No   . Sexual activity: Yes     Partners: Female     Other Topics Concern   . Not on file     Social History Narrative   . No narrative on file       The following sections were reviewed this encounter by the provider:   Allergies  Meds  Problems         ROS:  Review of Systems   Constitutional:  Negative for activity change, appetite change, fatigue, fever and unexpected weight change.   HENT: Negative for congestion, hearing loss, postnasal drip, sore throat, tinnitus, trouble swallowing and voice change.    Eyes: Negative for photophobia and visual disturbance.   Respiratory: Negative for cough, chest tightness, shortness of breath and wheezing.    Cardiovascular: Negative for chest pain, palpitations and leg swelling.   Gastrointestinal: Negative for abdominal distention, abdominal pain, blood in stool, constipation, diarrhea, nausea and vomiting.   Genitourinary: Negative for difficulty urinating, discharge, dysuria, flank pain, frequency, penile pain, penile swelling, scrotal swelling, testicular pain and urgency.   Musculoskeletal: Negative for arthralgias, back pain, joint swelling, myalgias and neck pain.   Neurological: Negative for dizziness, weakness, numbness and headaches.   Hematological: Does not bruise/bleed easily.   Psychiatric/Behavioral: Negative for confusion, decreased concentration, dysphoric mood and sleep disturbance. The patient is not nervous/anxious.        Vitals:  BP 122/73 (BP Site: Left arm, Patient Position: Sitting, Cuff Size: Large) Comment: After 3 mins resting  Pulse (!) 53   Temp 97.3 F (36.3 C) (Tympanic)   Resp 18   Ht 1.702 m (5\' 7" )   Wt 83 kg (183 lb)    SpO2 98%   BMI 28.66 kg/m      Objective:     Physical Exam:  Physical Exam   Constitutional: He is oriented to person, place, and time. He appears well-developed and well-nourished.   HENT:   Head: Normocephalic and atraumatic.   Right Ear: External ear normal.   Left Ear: External ear normal.   Mouth/Throat: Oropharynx is clear and moist. No oropharyngeal exudate.   Eyes: Pupils are equal, round, and reactive to light. Conjunctivae and EOM are normal. Right eye exhibits no discharge. Left eye exhibits no discharge.   Neck: Normal range of motion. Neck supple.   Cardiovascular: Normal rate, regular rhythm, normal heart sounds and intact distal pulses.  Exam reveals no friction rub.    No murmur heard.  Pulmonary/Chest: Effort normal and breath sounds normal. No respiratory distress. He has no wheezes.   Abdominal: Soft. Bowel sounds are normal. He exhibits no distension and no mass. There is no tenderness. Hernia confirmed negative in the right inguinal area and confirmed negative in the left inguinal area.   Genitourinary: Testes normal and penis normal. Right testis shows no mass, no swelling and no tenderness. Left testis shows no mass, no swelling and no tenderness. Left testis is descended. Circumcised.   Musculoskeletal: Normal range of motion. He exhibits no edema, tenderness or deformity.   Lymphadenopathy:     He has no cervical adenopathy. No inguinal adenopathy noted on the right or left side.   Neurological: He is alert and oriented to person, place, and time. He has normal reflexes. No cranial nerve deficit.   Skin: Skin is warm and dry.   Psychiatric: He has a normal mood and affect. His behavior is normal. Thought content normal.        Assessment:     1. Annual physical exam  CBC and differential    Comprehensive metabolic panel    Lipid panel    TSH    Urinalysis    Hemoglobin A1C    Vitamin D,25 OH, Total         Plan:     Nutrition,  physical activity, healthy weight,  Sexual behavior , dental  and mental health,, immunizations, screening- d/w pt  Joya Willmott Alvis Lemmings, MD

## 2017-08-12 NOTE — Patient Instructions (Signed)
See me once yearly for annual preventative visit.  This is different and separate from any problem-related visit.      Get a flu vaccine yearly in the fall.  Increase vegetables, fruits, and fiber in your diet.  Exercise at least 30 minutes 5 days per week.  Always wear your seatbelt in the car.  Wear sunscreen that is broad-spectrum (UVA/UVB) and at least SPF 30.  Avoid heavy drinking (>14 drinks per week).  Goal Blood Pressure <140/90  Schedule dental exam and cleaning every 6 months  Have your vision checked every 1-2 years.    Next colonoscopy due age 52    The best website for medical information is called UpToDate.  It's free for patients.  [email or web address]  The [facility] website also has good information.  [email or web address].[email or web address]    male

## 2017-08-12 NOTE — Progress Notes (Signed)
Have you seen any specialists/other providers since your last visit with [place]?    Yes  Podiatrist, Derm    Arm preference verified?   Yes    The patient is due for nothing at this time, HM is up-to-date.

## 2017-08-14 ENCOUNTER — Other Ambulatory Visit (INDEPENDENT_AMBULATORY_CARE_PROVIDER_SITE_OTHER): Payer: Self-pay | Admitting: Geriatric Medicine

## 2017-08-14 DIAGNOSIS — E559 Vitamin D deficiency, unspecified: Secondary | ICD-10-CM

## 2017-08-14 MED ORDER — VITAMIN D (ERGOCALCIFEROL) 1.25 MG (50000 UT) PO CAPS
50000.00 [IU] | ORAL_CAPSULE | ORAL | 4 refills | Status: AC
Start: 2017-08-14 — End: ?

## 2018-02-17 ENCOUNTER — Telehealth (INDEPENDENT_AMBULATORY_CARE_PROVIDER_SITE_OTHER): Payer: Self-pay | Admitting: Geriatric Medicine

## 2018-02-17 NOTE — Telephone Encounter (Signed)
Pt called asking for the date of his last Tdap shot.     Call back number [phone]

## 2018-02-17 NOTE — Telephone Encounter (Signed)
Spoke to pt and advised was on 07/26/14.  Pt showed understanding of this.

## 2018-10-12 ENCOUNTER — Encounter (INDEPENDENT_AMBULATORY_CARE_PROVIDER_SITE_OTHER): Payer: Self-pay

## 2019-07-05 ENCOUNTER — Ambulatory Visit: Payer: BC Managed Care – PPO | Attending: Internal Medicine

## 2019-07-05 DIAGNOSIS — Z20822 Contact with and (suspected) exposure to covid-19: Secondary | ICD-10-CM

## 2019-07-06 LAB — NOVEL CORONAVIRUS, NAA: SARS-CoV-2, NAA: NOT DETECTED

## 2019-07-10 ENCOUNTER — Telehealth: Payer: Self-pay | Admitting: General Practice

## 2019-07-10 NOTE — Telephone Encounter (Signed)
Negative COVID results given. Patient results "NOT Detected." Caller expressed understanding. ° °

## 2019-07-27 ENCOUNTER — Ambulatory Visit: Payer: BC Managed Care – PPO | Attending: Internal Medicine

## 2019-07-27 DIAGNOSIS — Z20822 Contact with and (suspected) exposure to covid-19: Secondary | ICD-10-CM | POA: Insufficient documentation

## 2019-07-28 LAB — NOVEL CORONAVIRUS, NAA: SARS-CoV-2, NAA: NOT DETECTED

## 2019-08-14 ENCOUNTER — Ambulatory Visit: Payer: BC Managed Care – PPO | Attending: Internal Medicine

## 2019-08-14 DIAGNOSIS — Z20822 Contact with and (suspected) exposure to covid-19: Secondary | ICD-10-CM | POA: Insufficient documentation

## 2019-08-15 LAB — NOVEL CORONAVIRUS, NAA: SARS-CoV-2, NAA: NOT DETECTED

## 2019-08-21 DIAGNOSIS — Z23 Encounter for immunization: Secondary | ICD-10-CM | POA: Diagnosis not present

## 2019-09-19 DIAGNOSIS — Z23 Encounter for immunization: Secondary | ICD-10-CM | POA: Diagnosis not present

## 2019-09-28 ENCOUNTER — Other Ambulatory Visit (INDEPENDENT_AMBULATORY_CARE_PROVIDER_SITE_OTHER): Payer: Self-pay | Admitting: Geriatric Medicine

## 2019-10-02 DIAGNOSIS — E7800 Pure hypercholesterolemia, unspecified: Secondary | ICD-10-CM | POA: Diagnosis not present

## 2019-10-02 DIAGNOSIS — Z79899 Other long term (current) drug therapy: Secondary | ICD-10-CM | POA: Diagnosis not present

## 2019-10-02 DIAGNOSIS — R9431 Abnormal electrocardiogram [ECG] [EKG]: Secondary | ICD-10-CM | POA: Diagnosis not present

## 2019-10-18 DIAGNOSIS — M542 Cervicalgia: Secondary | ICD-10-CM | POA: Diagnosis not present

## 2019-10-18 DIAGNOSIS — M9901 Segmental and somatic dysfunction of cervical region: Secondary | ICD-10-CM | POA: Diagnosis not present

## 2019-10-18 DIAGNOSIS — M9902 Segmental and somatic dysfunction of thoracic region: Secondary | ICD-10-CM | POA: Diagnosis not present

## 2019-10-18 DIAGNOSIS — M5412 Radiculopathy, cervical region: Secondary | ICD-10-CM | POA: Diagnosis not present

## 2019-10-23 DIAGNOSIS — M9902 Segmental and somatic dysfunction of thoracic region: Secondary | ICD-10-CM | POA: Diagnosis not present

## 2019-10-23 DIAGNOSIS — M5412 Radiculopathy, cervical region: Secondary | ICD-10-CM | POA: Diagnosis not present

## 2019-10-23 DIAGNOSIS — M9901 Segmental and somatic dysfunction of cervical region: Secondary | ICD-10-CM | POA: Diagnosis not present

## 2019-10-23 DIAGNOSIS — M542 Cervicalgia: Secondary | ICD-10-CM | POA: Diagnosis not present

## 2019-10-24 DIAGNOSIS — M542 Cervicalgia: Secondary | ICD-10-CM | POA: Diagnosis not present

## 2019-10-24 DIAGNOSIS — M5412 Radiculopathy, cervical region: Secondary | ICD-10-CM | POA: Diagnosis not present

## 2019-10-24 DIAGNOSIS — M9902 Segmental and somatic dysfunction of thoracic region: Secondary | ICD-10-CM | POA: Diagnosis not present

## 2019-10-24 DIAGNOSIS — M9901 Segmental and somatic dysfunction of cervical region: Secondary | ICD-10-CM | POA: Diagnosis not present

## 2019-10-25 DIAGNOSIS — M542 Cervicalgia: Secondary | ICD-10-CM | POA: Diagnosis not present

## 2019-10-25 DIAGNOSIS — M9902 Segmental and somatic dysfunction of thoracic region: Secondary | ICD-10-CM | POA: Diagnosis not present

## 2019-10-25 DIAGNOSIS — M5412 Radiculopathy, cervical region: Secondary | ICD-10-CM | POA: Diagnosis not present

## 2019-10-25 DIAGNOSIS — M9901 Segmental and somatic dysfunction of cervical region: Secondary | ICD-10-CM | POA: Diagnosis not present

## 2019-10-30 DIAGNOSIS — M5412 Radiculopathy, cervical region: Secondary | ICD-10-CM | POA: Diagnosis not present

## 2019-10-30 DIAGNOSIS — M542 Cervicalgia: Secondary | ICD-10-CM | POA: Diagnosis not present

## 2019-10-30 DIAGNOSIS — M9902 Segmental and somatic dysfunction of thoracic region: Secondary | ICD-10-CM | POA: Diagnosis not present

## 2019-10-30 DIAGNOSIS — M9901 Segmental and somatic dysfunction of cervical region: Secondary | ICD-10-CM | POA: Diagnosis not present

## 2019-11-06 DIAGNOSIS — M542 Cervicalgia: Secondary | ICD-10-CM | POA: Diagnosis not present

## 2019-11-06 DIAGNOSIS — M5412 Radiculopathy, cervical region: Secondary | ICD-10-CM | POA: Diagnosis not present

## 2019-11-06 DIAGNOSIS — M9902 Segmental and somatic dysfunction of thoracic region: Secondary | ICD-10-CM | POA: Diagnosis not present

## 2019-11-06 DIAGNOSIS — M9901 Segmental and somatic dysfunction of cervical region: Secondary | ICD-10-CM | POA: Diagnosis not present

## 2019-11-08 DIAGNOSIS — M5412 Radiculopathy, cervical region: Secondary | ICD-10-CM | POA: Diagnosis not present

## 2019-11-08 DIAGNOSIS — M9902 Segmental and somatic dysfunction of thoracic region: Secondary | ICD-10-CM | POA: Diagnosis not present

## 2019-11-08 DIAGNOSIS — M9901 Segmental and somatic dysfunction of cervical region: Secondary | ICD-10-CM | POA: Diagnosis not present

## 2019-11-08 DIAGNOSIS — M542 Cervicalgia: Secondary | ICD-10-CM | POA: Diagnosis not present

## 2019-11-10 DIAGNOSIS — M9902 Segmental and somatic dysfunction of thoracic region: Secondary | ICD-10-CM | POA: Diagnosis not present

## 2019-11-10 DIAGNOSIS — M9901 Segmental and somatic dysfunction of cervical region: Secondary | ICD-10-CM | POA: Diagnosis not present

## 2019-11-10 DIAGNOSIS — M542 Cervicalgia: Secondary | ICD-10-CM | POA: Diagnosis not present

## 2019-11-10 DIAGNOSIS — M5412 Radiculopathy, cervical region: Secondary | ICD-10-CM | POA: Diagnosis not present

## 2019-12-04 DIAGNOSIS — M9901 Segmental and somatic dysfunction of cervical region: Secondary | ICD-10-CM | POA: Diagnosis not present

## 2019-12-04 DIAGNOSIS — M9902 Segmental and somatic dysfunction of thoracic region: Secondary | ICD-10-CM | POA: Diagnosis not present

## 2019-12-04 DIAGNOSIS — M5412 Radiculopathy, cervical region: Secondary | ICD-10-CM | POA: Diagnosis not present

## 2019-12-04 DIAGNOSIS — M542 Cervicalgia: Secondary | ICD-10-CM | POA: Diagnosis not present

## 2019-12-05 DIAGNOSIS — M542 Cervicalgia: Secondary | ICD-10-CM | POA: Diagnosis not present

## 2019-12-05 DIAGNOSIS — M9901 Segmental and somatic dysfunction of cervical region: Secondary | ICD-10-CM | POA: Diagnosis not present

## 2019-12-05 DIAGNOSIS — M5412 Radiculopathy, cervical region: Secondary | ICD-10-CM | POA: Diagnosis not present

## 2019-12-05 DIAGNOSIS — M9902 Segmental and somatic dysfunction of thoracic region: Secondary | ICD-10-CM | POA: Diagnosis not present

## 2019-12-07 DIAGNOSIS — M9902 Segmental and somatic dysfunction of thoracic region: Secondary | ICD-10-CM | POA: Diagnosis not present

## 2019-12-07 DIAGNOSIS — M542 Cervicalgia: Secondary | ICD-10-CM | POA: Diagnosis not present

## 2019-12-07 DIAGNOSIS — M5412 Radiculopathy, cervical region: Secondary | ICD-10-CM | POA: Diagnosis not present

## 2019-12-07 DIAGNOSIS — M9901 Segmental and somatic dysfunction of cervical region: Secondary | ICD-10-CM | POA: Diagnosis not present

## 2019-12-11 DIAGNOSIS — M542 Cervicalgia: Secondary | ICD-10-CM | POA: Diagnosis not present

## 2019-12-11 DIAGNOSIS — M5412 Radiculopathy, cervical region: Secondary | ICD-10-CM | POA: Diagnosis not present

## 2019-12-11 DIAGNOSIS — M9901 Segmental and somatic dysfunction of cervical region: Secondary | ICD-10-CM | POA: Diagnosis not present

## 2019-12-11 DIAGNOSIS — M9902 Segmental and somatic dysfunction of thoracic region: Secondary | ICD-10-CM | POA: Diagnosis not present

## 2019-12-12 ENCOUNTER — Ambulatory Visit: Payer: Self-pay

## 2019-12-12 ENCOUNTER — Ambulatory Visit (INDEPENDENT_AMBULATORY_CARE_PROVIDER_SITE_OTHER): Payer: BC Managed Care – PPO | Admitting: Orthopaedic Surgery

## 2019-12-12 ENCOUNTER — Encounter: Payer: Self-pay | Admitting: Orthopaedic Surgery

## 2019-12-12 DIAGNOSIS — M542 Cervicalgia: Secondary | ICD-10-CM

## 2019-12-12 MED ORDER — PREDNISONE 10 MG (21) PO TBPK: ORAL_TABLET | ORAL | 0 refills | Status: DC

## 2019-12-12 MED ORDER — METAXALONE 800 MG PO TABS: 800.0000 mg | ORAL_TABLET | Freq: Three times a day (TID) | ORAL | 0 refills | Status: DC

## 2019-12-12 NOTE — Progress Notes (Signed)
Office Visit Note   Patient: Jack Pearson           Date of Birth: 08-31-1980           MRN: [id number] Visit Date: 12/12/2019              Requested by: No referring provider defined for this encounter. PCP: Blair Heys, MD   Assessment & Plan: Visit Diagnoses:  1. Neck pain     Plan: Impression is left shoulder myofascial syndrome.  I think this is due to partly his recent stress.  I have recommended starting on a prednisone Dosepak as well as Skelaxin.  Home exercises were demonstrated.  He will use heat and over-the-counter IcyHot for the symptoms.  Questions encouraged and answered.  Follow-up as needed.  Follow-Up Instructions: Return if symptoms worsen or fail to improve.   Orders:  Orders Placed This Encounter  Procedures  . XR Cervical Spine 2 or 3 views   Meds ordered this encounter  Medications  . DISCONTD: predniSONE (STERAPRED UNI-PAK 21 TAB) 10 MG (21) TBPK tablet    Sig: Take as directed    Dispense:  21 tablet    Refill:  0  . DISCONTD: metaxalone (SKELAXIN) 800 MG tablet    Sig: Take 1 tablet (800 mg total) by mouth 3 (three) times daily.    Dispense:  20 tablet    Refill:  0  . metaxalone (SKELAXIN) 800 MG tablet    Sig: Take 1 tablet (800 mg total) by mouth 3 (three) times daily.    Dispense:  20 tablet    Refill:  0  . predniSONE (STERAPRED UNI-PAK 21 TAB) 10 MG (21) TBPK tablet    Sig: Take as directed    Dispense:  21 tablet    Refill:  0      Procedures: No procedures performed   Clinical Data: No additional findings.   Subjective: Chief Complaint  Patient presents with  . Neck - Pain    Jack Pearson is a 39 year old gentleman who I know from tennis who comes in with 9 days worsening neck and left sided shoulder pain. Denies any radiculopathy. He has not had any injuries. He is self-admittedly states that he has been under a lot of stress due to his work. He has tried dry needling with some relief. He has seen a chiropractor without  relief and may have actually made it worse. He denies any numbness and tingling. He has taken some old Flexeril which he did not notice much relief.   Review of Systems  Constitutional: Negative.   All other systems reviewed and are negative.    Objective: Vital Signs: There were no vitals taken for this visit.  Physical Exam Vitals and nursing note reviewed.  Constitutional:      Appearance: He is well-developed.  HENT:     Head: Normocephalic and atraumatic.  Eyes:     Pupils: Pupils are equal, round, and reactive to light.  Pulmonary:     Effort: Pulmonary effort is normal.  Abdominal:     Palpations: Abdomen is soft.  Musculoskeletal:        General: Normal range of motion.     Cervical back: Neck supple.  Skin:    General: Skin is warm.  Neurological:     Mental Status: He is alert and oriented to person, place, and time.  Psychiatric:        Behavior: Behavior normal.        Thought  Content: Thought content normal.        Judgment: Judgment normal.     Ortho Exam Cervical spine shows normal range of motion. Negative Spurling's. No tenderness of the spinous processes. Negative Lhermitte sign. No focal motor or sensory deficits.  He has discomfort with palpation in the trapezius region.  Rhomboids are nontender.  Shoulder exam is unremarkable. Specialty Comments:  No specialty comments available.  Imaging: XR Cervical Spine 2 or 3 views  Result Date: 12/12/2019 No acute or structural abnormalities    PMFS History: There are no problems to display for this patient.  History reviewed. No pertinent past medical history.  History reviewed. No pertinent family history.  History reviewed. No pertinent surgical history. Social History   Occupational History  . Not on file  Tobacco Use  . Smoking status: Not on file  Substance and Sexual Activity  . Alcohol use: Not on file  . Drug use: Not on file  . Sexual activity: Not on file

## 2019-12-13 ENCOUNTER — Telehealth: Payer: Self-pay

## 2019-12-13 MED ORDER — METHOCARBAMOL 500 MG PO TABS: 500.0000 mg | ORAL_TABLET | Freq: Four times a day (QID) | ORAL | 2 refills | Status: DC | PRN

## 2019-12-13 NOTE — Telephone Encounter (Signed)
I sent robaxin

## 2019-12-13 NOTE — Telephone Encounter (Signed)
Spoke to patient. He states  he called his ins and the ins company called [place]. They said that were had done a PA on RX. I told him I am usually the one that does that (If it requires a PA). He said someone had already faxed something to ins company. I told him we can start a new PA request. He states can Dr just prescribe another RX instead since ins does not cover Metzalone. He states he leave out of town Friday.   [facility] [address].

## 2019-12-13 NOTE — Telephone Encounter (Signed)
Called patient. No answer LMOM

## 2019-12-13 NOTE — Telephone Encounter (Signed)
Patient called in saying he is waiting for his medication Everlene Balls)  to be approved and sent to pharmacy.

## 2020-05-04 DIAGNOSIS — Z20822 Contact with and (suspected) exposure to covid-19: Secondary | ICD-10-CM | POA: Diagnosis not present

## 2020-06-06 DIAGNOSIS — Z20822 Contact with and (suspected) exposure to covid-19: Secondary | ICD-10-CM | POA: Diagnosis not present

## 2020-08-08 DIAGNOSIS — E559 Vitamin D deficiency, unspecified: Secondary | ICD-10-CM | POA: Diagnosis not present

## 2020-08-08 DIAGNOSIS — H9012 Conductive hearing loss, unilateral, left ear, with unrestricted hearing on the contralateral side: Secondary | ICD-10-CM | POA: Diagnosis not present

## 2020-08-08 DIAGNOSIS — Z79899 Other long term (current) drug therapy: Secondary | ICD-10-CM | POA: Diagnosis not present

## 2020-08-08 DIAGNOSIS — Z0000 Encounter for general adult medical examination without abnormal findings: Secondary | ICD-10-CM | POA: Diagnosis not present

## 2020-08-08 DIAGNOSIS — H6122 Impacted cerumen, left ear: Secondary | ICD-10-CM | POA: Diagnosis not present

## 2020-08-08 DIAGNOSIS — E7800 Pure hypercholesterolemia, unspecified: Secondary | ICD-10-CM | POA: Diagnosis not present

## 2020-08-23 DIAGNOSIS — M542 Cervicalgia: Secondary | ICD-10-CM | POA: Diagnosis not present

## 2020-08-23 DIAGNOSIS — R2689 Other abnormalities of gait and mobility: Secondary | ICD-10-CM | POA: Diagnosis not present

## 2020-08-30 DIAGNOSIS — M542 Cervicalgia: Secondary | ICD-10-CM | POA: Diagnosis not present

## 2020-08-30 DIAGNOSIS — R2689 Other abnormalities of gait and mobility: Secondary | ICD-10-CM | POA: Diagnosis not present

## 2020-09-05 DIAGNOSIS — M542 Cervicalgia: Secondary | ICD-10-CM | POA: Diagnosis not present

## 2020-09-06 DIAGNOSIS — R2689 Other abnormalities of gait and mobility: Secondary | ICD-10-CM | POA: Diagnosis not present

## 2020-09-06 DIAGNOSIS — M542 Cervicalgia: Secondary | ICD-10-CM | POA: Diagnosis not present

## 2020-09-13 DIAGNOSIS — M542 Cervicalgia: Secondary | ICD-10-CM | POA: Diagnosis not present

## 2020-09-13 DIAGNOSIS — R2689 Other abnormalities of gait and mobility: Secondary | ICD-10-CM | POA: Diagnosis not present

## 2020-10-04 DIAGNOSIS — R2689 Other abnormalities of gait and mobility: Secondary | ICD-10-CM | POA: Diagnosis not present

## 2020-10-04 DIAGNOSIS — M542 Cervicalgia: Secondary | ICD-10-CM | POA: Diagnosis not present

## 2020-10-11 DIAGNOSIS — R2689 Other abnormalities of gait and mobility: Secondary | ICD-10-CM | POA: Diagnosis not present

## 2020-10-11 DIAGNOSIS — M542 Cervicalgia: Secondary | ICD-10-CM | POA: Diagnosis not present

## 2020-11-01 DIAGNOSIS — R2689 Other abnormalities of gait and mobility: Secondary | ICD-10-CM | POA: Diagnosis not present

## 2020-11-01 DIAGNOSIS — M542 Cervicalgia: Secondary | ICD-10-CM | POA: Diagnosis not present

## 2020-11-07 ENCOUNTER — Other Ambulatory Visit: Payer: Self-pay

## 2020-11-07 ENCOUNTER — Encounter: Payer: Self-pay | Admitting: Orthopaedic Surgery

## 2020-11-07 ENCOUNTER — Ambulatory Visit (INDEPENDENT_AMBULATORY_CARE_PROVIDER_SITE_OTHER): Payer: BC Managed Care – PPO | Admitting: Orthopaedic Surgery

## 2020-11-07 DIAGNOSIS — M542 Cervicalgia: Secondary | ICD-10-CM | POA: Diagnosis not present

## 2020-11-07 NOTE — Progress Notes (Signed)
   Office Visit Note   Patient: Jack Pearson           Date of Birth: 1981-03-28           MRN: [id number] Visit Date: 11/07/2020              Requested by: Blair Heys, MD [address] [place],  [place] [address] PCP: Blair Heys, MD   Assessment & Plan: Visit Diagnoses:  1. Neck pain     Plan: Impression is chronic neck pain without improvement from extensive conservative treatments such as physical therapy, rest, medications, activity modifications.  At this point we will need to obtain MRI of the cervical spine to rule out structural abnormalities.  I will call Jack Pearson with the MRI results.  Follow-Up Instructions: No follow-ups on file.   Orders:  No orders of the defined types were placed in this encounter.  No orders of the defined types were placed in this encounter.     Procedures: No procedures performed   Clinical Data: No additional findings.   Subjective: Chief Complaint  Patient presents with  . Neck - Pain    Jack Pearson is a 40 year old gentleman following up for chronic neck pain.  I saw him about a year ago for the same issue and he states that he continues to have problems with left-sided neck pain that radiates down to the upper back.  He denies any numbness and tingling in the hands.  He feels some temporary improvement from deep massages and he feels increased pain after playing tennis.  He has been to physical therapy and done dry needling with partial relief.  Overall he has been dealing with this issue since I saw him about a year ago.  Denies any upper motor neuron signs or symptoms.   Review of Systems   Objective: Vital Signs: There were no vitals taken for this visit.  Physical Exam  Ortho Exam Cervical spine range of motion is mildly limited to rotation.  Negative Lhermitte.  Equivocal Spurling.  No muscular weakness or sensory deficits in the upper extremities.  Normal reflexes. Specialty Comments:  No specialty  comments available.  Imaging: No results found.   PMFS History: There are no problems to display for this patient.  History reviewed. No pertinent past medical history.  History reviewed. No pertinent family history.  History reviewed. No pertinent surgical history. Social History   Occupational History  . Not on file  Tobacco Use  . Smoking status: Not on file  . Smokeless tobacco: Not on file  Substance and Sexual Activity  . Alcohol use: Not on file  . Drug use: Not on file  . Sexual activity: Not on file

## 2020-11-08 ENCOUNTER — Other Ambulatory Visit: Payer: Self-pay

## 2020-11-08 DIAGNOSIS — R2689 Other abnormalities of gait and mobility: Secondary | ICD-10-CM | POA: Diagnosis not present

## 2020-11-08 DIAGNOSIS — M542 Cervicalgia: Secondary | ICD-10-CM | POA: Diagnosis not present

## 2020-11-15 DIAGNOSIS — R2689 Other abnormalities of gait and mobility: Secondary | ICD-10-CM | POA: Diagnosis not present

## 2020-11-15 DIAGNOSIS — M542 Cervicalgia: Secondary | ICD-10-CM | POA: Diagnosis not present

## 2020-11-17 ENCOUNTER — Ambulatory Visit
Admission: RE | Admit: 2020-11-17 | Discharge: 2020-11-17 | Disposition: A | Discharge status: Home / Self Care | Payer: BC Managed Care – PPO | Source: Ambulatory Visit | Attending: Orthopaedic Surgery | Admitting: Orthopaedic Surgery

## 2020-11-17 ENCOUNTER — Telehealth: Payer: Self-pay | Admitting: Orthopaedic Surgery

## 2020-11-17 DIAGNOSIS — G8929 Other chronic pain: Secondary | ICD-10-CM | POA: Diagnosis not present

## 2020-11-17 DIAGNOSIS — M2578 Osteophyte, vertebrae: Secondary | ICD-10-CM | POA: Diagnosis not present

## 2020-11-17 DIAGNOSIS — M50322 Other cervical disc degeneration at C5-C6 level: Secondary | ICD-10-CM | POA: Diagnosis not present

## 2020-11-17 DIAGNOSIS — M542 Cervicalgia: Secondary | ICD-10-CM

## 2020-11-17 DIAGNOSIS — M50222 Other cervical disc displacement at C5-C6 level: Secondary | ICD-10-CM | POA: Diagnosis not present

## 2020-11-17 NOTE — Progress Notes (Signed)
Please refer to [facility] ASAP for c spine ESI.  Thanks.

## 2020-11-17 NOTE — Telephone Encounter (Signed)
Spoke with [employer] and explained MRI findings.  Based on lack of relief from 1 year of PT and conservative treatments, I have recommended a consultation with Dr. Alvester Morin for cervical spine ESI.  Tedd is in agreement to proceed and look forward to hearing from about the ESI.

## 2020-11-18 ENCOUNTER — Other Ambulatory Visit: Payer: Self-pay

## 2020-11-18 DIAGNOSIS — M542 Cervicalgia: Secondary | ICD-10-CM

## 2020-12-02 ENCOUNTER — Other Ambulatory Visit: Payer: Self-pay

## 2020-12-02 ENCOUNTER — Ambulatory Visit (INDEPENDENT_AMBULATORY_CARE_PROVIDER_SITE_OTHER): Payer: BC Managed Care – PPO | Admitting: Physical Medicine and Rehabilitation

## 2020-12-02 ENCOUNTER — Encounter: Payer: Self-pay | Admitting: Physical Medicine and Rehabilitation

## 2020-12-02 ENCOUNTER — Telehealth: Payer: Self-pay | Admitting: Physical Medicine and Rehabilitation

## 2020-12-02 VITALS — BP 126/84 | HR 66

## 2020-12-02 DIAGNOSIS — M5030 Other cervical disc degeneration, unspecified cervical region: Secondary | ICD-10-CM

## 2020-12-02 DIAGNOSIS — M25512 Pain in left shoulder: Secondary | ICD-10-CM

## 2020-12-02 DIAGNOSIS — M2578 Osteophyte, vertebrae: Secondary | ICD-10-CM

## 2020-12-02 DIAGNOSIS — G8929 Other chronic pain: Secondary | ICD-10-CM

## 2020-12-02 DIAGNOSIS — M542 Cervicalgia: Secondary | ICD-10-CM | POA: Diagnosis not present

## 2020-12-02 DIAGNOSIS — M5412 Radiculopathy, cervical region: Secondary | ICD-10-CM

## 2020-12-02 NOTE — Telephone Encounter (Signed)
Patient called. He prefers to come later today if Dr. Alvester Morin has an opening. If not he says he can do 1pm. Would  like a call back if he can come later. His call back number is [phone]

## 2020-12-02 NOTE — Progress Notes (Signed)
Pt state neck pain that travels to his left shoulder. Pt state he feels pain when he turns his head. Pt state he doesn't do anything for the pain. Pt state he had PT in the past.  Numeric Pain Rating Scale and Functional Assessment Average Pain 8 Pain Right Now 6 My pain is constant, dull, and stabbing Pain is worse with: some activites Pain improves with: rest, therapy/exercise, and medication   In the last MONTH (on 0-10 scale) has pain interfered with the following?  1. General activity like being  able to carry out your everyday physical activities such as walking, climbing stairs, carrying groceries, or moving a chair?  Rating(8)  2. Relation with others like being able to carry out your usual social activities and roles such as  activities at home, at work and in your community. Rating(7)  3. Enjoyment of life such that you have  been bothered by emotional problems such as feeling anxious, depressed or irritable?  Rating(9)

## 2020-12-02 NOTE — Progress Notes (Signed)
Jack Pearson - 40 y.o. male MRN [id number]  Date of birth: 08-10-1980  Office Visit Note: Visit Date: 12/02/2020 PCP: Blair Heys, MD Referred by: Blair Heys, MD  Subjective: Chief Complaint  Patient presents with   Neck - Pain   Left Shoulder - Pain   HPI: Jack Pearson is a 40 y.o. male who comes in today per the request of Dr. Glee Arvin for evaluation of chronic, worsening and severe left sided neck pain.  Dr. Roda Shutters wanted to see if he could have an epidural injection as soon as possible however the patient definitely want to see [place] beforehand for full evaluation and he had some questions and concerns to go over.  Pt reports pain ongoing for one year and worsens with movement. Pt describes pain as tightness and rates 6 out of 10. Pt states pain is relieved by rest and stretching exercises at home. Pt reports one episode of pain radiating to left arm, however the majority of his pain is localized to left neck. Pt has failed conservative treatments such as medications, physical therapy and dry needling. Pt also reports increased pain after receiving care at chiropractor.  With failure of conservative care including the physical therapy and chiropractic care and medications an MRI of the cervical spine was performed.  Pt's MRI exhibits general spondylitic change at C5-6 with some uncovertebral joint hypertrophy and mild foraminal narrowing at C4-5 and mild foraminal encroachment at C5-6 bilaterally. Pt reports he does play tennis frequently and has noticed that the pain is worse after prolonged activity. Pt denies any recent trauma or falls. Pt denies any focal weakness, numbness or tingling.   Review of Systems  Musculoskeletal:  Positive for neck pain.  Neurological:  Negative for tingling and focal weakness.  All other systems reviewed and are negative. Otherwise per HPI.  Assessment & Plan: Visit Diagnoses:    ICD-10-CM   1. Cervicalgia  M54.2     2. Chronic left shoulder pain   M25.512    G89.29     3. 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 Pt continues to have pain that worsens with movement and after activity. Pt states this pain is making daily tasks more difficult, also reports his pain becomes so severe that he has trouble taking care of his small children. Pt has failed conservative therapy and is coming into the office today to discuss plan of care. Pt's MRI from 2021 reviewed with him today. Risks and benefits of epidural steroid injections also discussed with patient. Pt is reassured and has decided to move forward with ESI.  Neck step in the plan will be diagnostic and hopefully therapeutic C7-T1 interlaminar ESI.  Clinically some of his pain seems like facet mediated pain although not much in the way of facet arthritis on the imaging.  Would consider facet joint block.  Patient also has shellfish allergy and was concerned about that with contrast.  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  He is going to call us back in terms of scheduling this appointment for the injection.   Meds & Orders: No orders of the defined types were placed in this encounter.  No orders of the defined types were placed in this encounter.   Follow-up: Return in about 1  week (around 12/09/2020) for C7-T1 interlaminar ESI.   Procedures: No procedures performed      Clinical History: MRI CERVICAL SPINE WITHOUT CONTRAST   TECHNIQUE: Multiplanar, multisequence MR imaging of the cervical spine was performed. No intravenous contrast was administered.   COMPARISON:  Radiograph 12/12/2019   FINDINGS: Alignment: Normal   Vertebrae: Normal marrow signal. No bone lesions or fractures.   Cord: Normal cord signal intensity. No cord lesions or syrinx.   Posterior Fossa, vertebral arteries, paraspinal  tissues: No significant findings.   Disc levels:   C2-3: No significant findings.   C3-4: No significant findings.   C4-5: Shallow right-sided disc osteophyte complex with mild foraminal narrowing and potential irritation of the right C5 nerve root.   C5-6: Mild degenerative disc disease with a bulging annulus, osteophytic ridging and mild uncinate spurring. There is slight flattening of the ventral thecal sac and slight narrowing the ventral CSF space. There is also mild foraminal encroachment bilaterally.   C6-7: No significant findings.   C7-T1: No significant findings.   IMPRESSION: 1. Shallow right-sided disc osteophyte complex at C4-5 with mild foraminal narrowing and potential irritation of the right C5 nerve root. 2. Mild degenerative disc disease at C5-6 with a bulging annulus, osteophytic ridging and mild uncinate spurring. There is slight flattening of the ventral thecal sac and slight narrowing of the ventral CSF space. There is also mild foraminal encroachment bilaterally.     Electronically Signed   By: Rudie Meyer M.D.   On: 11/17/2020 15:39   He reports that he has never smoked. He has never used smokeless tobacco. No results for input(s): HGBA1C, LABURIC in the last 8760 hours.  Objective:  VS:  HT:    WT:   BMI:     BP:126/84  HR:66bpm  TEMP: ( )  RESP:  Physical Exam HENT:     Head: Normocephalic.     Right Ear: Tympanic membrane normal.     Left Ear: Tympanic membrane normal.     Nose: Nose normal.     Mouth/Throat:     Mouth: Mucous membranes are moist.  Eyes:     Pupils: Pupils are equal, round, and reactive to light.  Cardiovascular:     Rate and Rhythm: Normal rate and regular rhythm.     Pulses: Normal pulses.  Pulmonary:     Effort: Pulmonary effort is normal.  Abdominal:     General: There is no distension.  Musculoskeletal:     Cervical back: Normal range of motion and neck supple.     Comments: Pt standing with forward  flexed cervical spine. No discomfort noted with flexion and extension. Pain noted upon side-to-side rotation. Good strength noted to bilateral upper extremities. Sensation intact bilaterally. Negative Hoffman's sign.   Skin:    General: Skin is warm and dry.     Capillary Refill: Capillary refill takes less than 2 seconds.  Neurological:     General: No focal deficit present.     Mental Status: He is alert and oriented to person, place, and time.  Psychiatric:        Mood and Affect: Mood normal.    Ortho Exam  Imaging: No results found.  Past Medical/Family/Surgical/Social History: Medications & Allergies reviewed per EMR, new medications updated. There are no problems to display for this patient.  History reviewed. No pertinent past medical history. History reviewed. No pertinent family history. History reviewed. No pertinent surgical history. Social History   Occupational History   Not  on file  Tobacco Use   Smoking status: Never   Smokeless tobacco: Never  Substance and Sexual Activity   Alcohol use: Not on file   Drug use: Not on file   Sexual activity: Not on file

## 2020-12-06 ENCOUNTER — Telehealth: Payer: Self-pay

## 2020-12-06 ENCOUNTER — Telehealth: Payer: Self-pay | Admitting: Physical Medicine and Rehabilitation

## 2020-12-06 NOTE — Telephone Encounter (Signed)
Patient called needing to R/S his appointment

## 2020-12-06 NOTE — Telephone Encounter (Signed)
Scheduled for C7-T1 IL on 6/30 at 1400 with driver.

## 2020-12-06 NOTE — Telephone Encounter (Signed)
Rescheduled

## 2020-12-06 NOTE — Telephone Encounter (Signed)
Patient called he is requesting a appointment with Dr.Newton to receive a cortisone injection call [phone]

## 2020-12-12 ENCOUNTER — Ambulatory Visit (INDEPENDENT_AMBULATORY_CARE_PROVIDER_SITE_OTHER): Payer: BC Managed Care – PPO | Admitting: Physical Medicine and Rehabilitation

## 2020-12-12 ENCOUNTER — Other Ambulatory Visit: Payer: Self-pay

## 2020-12-12 ENCOUNTER — Ambulatory Visit: Payer: Self-pay

## 2020-12-12 ENCOUNTER — Ambulatory Visit: Payer: BC Managed Care – PPO | Admitting: Physical Medicine and Rehabilitation

## 2020-12-12 VITALS — BP 144/79 | HR 75

## 2020-12-12 DIAGNOSIS — M5412 Radiculopathy, cervical region: Secondary | ICD-10-CM

## 2020-12-12 MED ORDER — BETAMETHASONE SOD PHOS & ACET 6 (3-3) MG/ML IJ SUSP
12.0000 mg | Freq: Once | INTRAMUSCULAR | Status: AC
  Administered 2020-12-12: 12 mg

## 2020-12-12 NOTE — Progress Notes (Signed)
Patient is having neck pain. +tightness. Not taking anything for pain. Worse when playing tennis.     Numeric Pain Rating Scale and Functional Assessment Average Pain 3   In the last MONTH (on 0-10 scale) has pain interfered with the following?  1. General activity like being  able to carry out your everyday physical activities such as walking, climbing stairs, carrying groceries, or moving a chair?  Rating(3)   +Driver, -BT, -Dye Allergies.

## 2020-12-13 NOTE — Progress Notes (Signed)
Jack Pearson - 40 y.o. male MRN [id number]  Date of birth: [birth year]  Office Visit Note: Visit Date: 12/12/2020 PCP: Blair Heys, MD Referred by: Blair Heys, MD  Subjective: Chief Complaint  Patient presents with   Neck - Pain   HPI:  Jack Pearson is a 40 y.o. male who comes in today for planned Left C7-T1 Cervical Interlaminar epidural steroid injection with fluoroscopic guidance.  The patient has failed conservative care including home exercise, medications, time and activity modification.  This injection will be diagnostic and hopefully therapeutic.  Please see requesting physician notes for further details and justification. MRI reviewed with images and spine model.  MRI reviewed in the note below.   Referring provider: Dr. Glee Arvin    ROS Otherwise per HPI.  Assessment & Plan: Visit Diagnoses:    ICD-10-CM   1. Cervical radiculopathy  M54.12 XR C-ARM NO REPORT    Epidural Steroid injection    betamethasone acetate-betamethasone sodium phosphate (CELESTONE) injection 12 mg      Plan: No additional findings.   Meds & Orders:  Meds ordered this encounter  Medications   betamethasone acetate-betamethasone sodium phosphate (CELESTONE) injection 12 mg    Orders Placed This Encounter  Procedures   XR C-ARM NO REPORT   Epidural Steroid injection    Follow-up: No follow-ups on file.   Procedures: No procedures performed  Cervical Epidural Steroid Injection - Interlaminar Approach with Fluoroscopic Guidance  Patient: Jack Pearson      Date of Birth: 08-22-1980 MRN: [id number] PCP: Blair Heys, MD      Visit Date: 12/12/2020   Universal Protocol:    Date/Time: 07/01/226:24 AM  Consent Given By: the patient  Position: PRONE  Additional Comments: Vital signs were monitored before and after the procedure. Patient was prepped and draped in the usual sterile fashion. The correct patient, procedure, and site was verified.   Injection Procedure  Details:   Procedure diagnoses: Cervical radiculopathy [M54.12]    Meds Administered:  Meds ordered this encounter  Medications   betamethasone acetate-betamethasone sodium phosphate (CELESTONE) injection 12 mg     Laterality: Left  Location/Site: C7-T1  Needle: 3.5 in., 20 ga. Tuohy  Needle Placement: Paramedian epidural space  Findings:  -Comments: Excellent flow of contrast into the epidural space.  Procedure Details: Using a paramedian approach from the side mentioned above, the region overlying the inferior lamina was localized under fluoroscopic visualization and the soft tissues overlying this structure were infiltrated with 4 ml. of 1% Lidocaine without Epinephrine. A # 20 gauge, Tuohy needle was inserted into the epidural space using a paramedian approach.  The epidural space was localized using loss of resistance along with contralateral oblique bi-planar fluoroscopic views.  After negative aspirate for air, blood, and CSF, a 2 ml. volume of Isovue-250 was injected into the epidural space and the flow of contrast was observed. Radiographs were obtained for documentation purposes.   The injectate was administered into the level noted above.  Additional Comments:  The patient tolerated the procedure well Dressing: 2 x 2 sterile gauze and Band-Aid    Post-procedure details: Patient was observed during the procedure. Post-procedure instructions were reviewed.  Patient left the clinic in stable condition.   Clinical History: MRI CERVICAL SPINE WITHOUT CONTRAST   TECHNIQUE: Multiplanar, multisequence MR imaging of the cervical spine was performed. No intravenous contrast was administered.   COMPARISON:  Radiograph 12/12/2019   FINDINGS: Alignment: Normal   Vertebrae: Normal marrow signal. No bone lesions or fractures.  Cord: Normal cord signal intensity. No cord lesions or syrinx.   Posterior Fossa, vertebral arteries, paraspinal tissues: No significant  findings.   Disc levels:   C2-3: No significant findings.   C3-4: No significant findings.   C4-5: Shallow right-sided disc osteophyte complex with mild foraminal narrowing and potential irritation of the right C5 nerve root.   C5-6: Mild degenerative disc disease with a bulging annulus, osteophytic ridging and mild uncinate spurring. There is slight flattening of the ventral thecal sac and slight narrowing the ventral CSF space. There is also mild foraminal encroachment bilaterally.   C6-7: No significant findings.   C7-T1: No significant findings.   IMPRESSION: 1. Shallow right-sided disc osteophyte complex at C4-5 with mild foraminal narrowing and potential irritation of the right C5 nerve root. 2. Mild degenerative disc disease at C5-6 with a bulging annulus, osteophytic ridging and mild uncinate spurring. There is slight flattening of the ventral thecal sac and slight narrowing of the ventral CSF space. There is also mild foraminal encroachment bilaterally.     Electronically Signed   By: Rudie Meyer M.D.   On: 11/17/2020 15:39     Objective:  VS:  HT:    WT:   BMI:     BP:(!) 144/79  HR:75bpm  TEMP: ( )  RESP:  Physical Exam Vitals and nursing note reviewed.  Constitutional:      General: He is not in acute distress.    Appearance: Normal appearance. He is not ill-appearing.  HENT:     Head: Normocephalic and atraumatic.     Right Ear: External ear normal.     Left Ear: External ear normal.  Eyes:     Extraocular Movements: Extraocular movements intact.  Cardiovascular:     Rate and Rhythm: Normal rate.     Pulses: Normal pulses.  Abdominal:     General: There is no distension.     Palpations: Abdomen is soft.  Musculoskeletal:        General: No signs of injury.     Cervical back: Neck supple. Tenderness present. No rigidity.     Right lower leg: No edema.     Left lower leg: No edema.     Comments: Patient has good strength in the upper  extremities with 5 out of 5 strength in wrist extension long finger flexion APB.  No intrinsic hand muscle atrophy.  Negative Hoffmann's test.  Lymphadenopathy:     Cervical: No cervical adenopathy.  Skin:    Findings: No erythema or rash.  Neurological:     General: No focal deficit present.     Mental Status: He is alert and oriented to person, place, and time.     Sensory: No sensory deficit.     Motor: No weakness or abnormal muscle tone.     Coordination: Coordination normal.  Psychiatric:        Mood and Affect: Mood normal.        Behavior: Behavior normal.     Imaging: XR C-ARM NO REPORT  Result Date: 12/12/2020 Please see Notes tab for imaging impression.

## 2020-12-13 NOTE — Procedures (Signed)
Cervical Epidural Steroid Injection - Interlaminar Approach with Fluoroscopic Guidance  Patient: Jack Pearson      Date of Birth: 03/20/1981 MRN: [id number] PCP: Blair Heys, MD      Visit Date: 12/12/2020   Universal Protocol:    Date/Time: 07/01/226:24 AM  Consent Given By: the patient  Position: PRONE  Additional Comments: Vital signs were monitored before and after the procedure. Patient was prepped and draped in the usual sterile fashion. The correct patient, procedure, and site was verified.   Injection Procedure Details:   Procedure diagnoses: Cervical radiculopathy [M54.12]    Meds Administered:  Meds ordered this encounter  Medications   betamethasone acetate-betamethasone sodium phosphate (CELESTONE) injection 12 mg     Laterality: Left  Location/Site: C7-T1  Needle: 3.5 in., 20 ga. Tuohy  Needle Placement: Paramedian epidural space  Findings:  -Comments: Excellent flow of contrast into the epidural space.  Procedure Details: Using a paramedian approach from the side mentioned above, the region overlying the inferior lamina was localized under fluoroscopic visualization and the soft tissues overlying this structure were infiltrated with 4 ml. of 1% Lidocaine without Epinephrine. A # 20 gauge, Tuohy needle was inserted into the epidural space using a paramedian approach.  The epidural space was localized using loss of resistance along with contralateral oblique bi-planar fluoroscopic views.  After negative aspirate for air, blood, and CSF, a 2 ml. volume of Isovue-250 was injected into the epidural space and the flow of contrast was observed. Radiographs were obtained for documentation purposes.   The injectate was administered into the level noted above.  Additional Comments:  The patient tolerated the procedure well Dressing: 2 x 2 sterile gauze and Band-Aid    Post-procedure details: Patient was observed during the procedure. Post-procedure  instructions were reviewed.  Patient left the clinic in stable condition.

## 2021-02-12 DIAGNOSIS — E65 Localized adiposity: Secondary | ICD-10-CM | POA: Diagnosis not present

## 2021-04-14 DIAGNOSIS — Z20822 Contact with and (suspected) exposure to covid-19: Secondary | ICD-10-CM | POA: Diagnosis not present

## 2021-04-28 DIAGNOSIS — Z20822 Contact with and (suspected) exposure to covid-19: Secondary | ICD-10-CM | POA: Diagnosis not present

## 2021-04-30 DIAGNOSIS — R52 Pain, unspecified: Secondary | ICD-10-CM | POA: Diagnosis not present

## 2021-04-30 DIAGNOSIS — J069 Acute upper respiratory infection, unspecified: Secondary | ICD-10-CM | POA: Diagnosis not present

## 2021-04-30 DIAGNOSIS — R059 Cough, unspecified: Secondary | ICD-10-CM | POA: Diagnosis not present

## 2021-05-01 DIAGNOSIS — G5600 Carpal tunnel syndrome, unspecified upper limb: Secondary | ICD-10-CM | POA: Diagnosis not present

## 2021-05-01 DIAGNOSIS — S29011A Strain of muscle and tendon of front wall of thorax, initial encounter: Secondary | ICD-10-CM | POA: Diagnosis not present

## 2021-05-01 DIAGNOSIS — R000 Tachycardia, unspecified: Secondary | ICD-10-CM | POA: Diagnosis not present

## 2021-05-03 DIAGNOSIS — Z20822 Contact with and (suspected) exposure to covid-19: Secondary | ICD-10-CM | POA: Diagnosis not present

## 2021-05-14 ENCOUNTER — Other Ambulatory Visit: Payer: Self-pay | Admitting: Family Medicine

## 2021-05-14 ENCOUNTER — Ambulatory Visit
Admission: RE | Admit: 2021-05-14 | Discharge: 2021-05-14 | Disposition: A | Discharge status: Home / Self Care | Payer: BC Managed Care – PPO | Source: Ambulatory Visit | Attending: Family Medicine | Admitting: Family Medicine

## 2021-05-14 DIAGNOSIS — R062 Wheezing: Secondary | ICD-10-CM | POA: Diagnosis not present

## 2021-05-14 DIAGNOSIS — R059 Cough, unspecified: Secondary | ICD-10-CM | POA: Diagnosis not present

## 2021-05-26 DIAGNOSIS — R0789 Other chest pain: Secondary | ICD-10-CM | POA: Diagnosis not present

## 2021-05-26 DIAGNOSIS — J019 Acute sinusitis, unspecified: Secondary | ICD-10-CM | POA: Diagnosis not present

## 2021-06-19 DIAGNOSIS — E559 Vitamin D deficiency, unspecified: Secondary | ICD-10-CM | POA: Diagnosis not present

## 2021-06-19 DIAGNOSIS — Z0000 Encounter for general adult medical examination without abnormal findings: Secondary | ICD-10-CM | POA: Diagnosis not present

## 2021-06-19 DIAGNOSIS — Z79899 Other long term (current) drug therapy: Secondary | ICD-10-CM | POA: Diagnosis not present

## 2021-06-19 DIAGNOSIS — E7800 Pure hypercholesterolemia, unspecified: Secondary | ICD-10-CM | POA: Diagnosis not present

## 2021-07-07 DIAGNOSIS — Z3009 Encounter for other general counseling and advice on contraception: Secondary | ICD-10-CM | POA: Diagnosis not present

## 2021-07-15 DIAGNOSIS — L309 Dermatitis, unspecified: Secondary | ICD-10-CM | POA: Diagnosis not present

## 2021-08-26 DIAGNOSIS — R079 Chest pain, unspecified: Secondary | ICD-10-CM | POA: Diagnosis not present

## 2021-09-18 DIAGNOSIS — Z79899 Other long term (current) drug therapy: Secondary | ICD-10-CM | POA: Diagnosis not present

## 2021-09-18 DIAGNOSIS — E7800 Pure hypercholesterolemia, unspecified: Secondary | ICD-10-CM | POA: Diagnosis not present

## 2021-09-29 DIAGNOSIS — H04123 Dry eye syndrome of bilateral lacrimal glands: Secondary | ICD-10-CM | POA: Diagnosis not present

## 2021-11-19 ENCOUNTER — Ambulatory Visit: Payer: BC Managed Care – PPO | Admitting: Orthopaedic Surgery

## 2021-11-21 DIAGNOSIS — L309 Dermatitis, unspecified: Secondary | ICD-10-CM | POA: Diagnosis not present

## 2021-11-21 DIAGNOSIS — S80862A Insect bite (nonvenomous), left lower leg, initial encounter: Secondary | ICD-10-CM | POA: Diagnosis not present

## 2021-12-02 ENCOUNTER — Encounter: Payer: Self-pay | Admitting: Orthopaedic Surgery

## 2021-12-02 ENCOUNTER — Ambulatory Visit (INDEPENDENT_AMBULATORY_CARE_PROVIDER_SITE_OTHER): Payer: BC Managed Care – PPO | Admitting: Orthopaedic Surgery

## 2021-12-02 ENCOUNTER — Ambulatory Visit (INDEPENDENT_AMBULATORY_CARE_PROVIDER_SITE_OTHER): Payer: BC Managed Care – PPO

## 2021-12-02 DIAGNOSIS — M25511 Pain in right shoulder: Secondary | ICD-10-CM

## 2021-12-02 DIAGNOSIS — G8929 Other chronic pain: Secondary | ICD-10-CM | POA: Diagnosis not present

## 2021-12-02 NOTE — Progress Notes (Signed)
Office Visit Note   Patient: Jack Pearson           Date of Birth: 08-11-1980           MRN: [id number] Visit Date: 12/02/2021              Requested by: Blair Heys, MD [address] [place],  [place] [address] PCP: Blair Heys, MD   Assessment & Plan: Visit Diagnoses:  1. Chronic right shoulder pain     Plan: Impression is chronic right shoulder pain.  I suspect he has labral pathology without instability.  Treatment options were reviewed.  Sounds like this has been bothering him for about 7 years with a short period of time in which it was doing well.  He has seen a prior orthopedist in [place].  At this point we will order an MR arthrogram of the right shoulder.  Follow-Up Instructions: No follow-ups on file.   Orders:  Orders Placed This Encounter  Procedures   XR Shoulder Right   No orders of the defined types were placed in this encounter.     Procedures: No procedures performed   Clinical Data: No additional findings.   Subjective: Chief Complaint  Patient presents with   Right Shoulder - Pain    HPI Jack Pearson is a 41 year old gentleman I know from tennis who comes in for 2 months worsening right shoulder pain.  He had a similar episode back in 2016 when he lived in [place] and he saw orthopedist at that time.  They treated it with rest and physical therapy.  He had an MRI at that time but not sure what the report said.  He has pain to the posterior shoulder that is worse with stretching out and reaching across his body.  Pain is better with rest.  Over-the-counter medicines do not help.  Ice does not help.  Right-hand-dominant.  Review of Systems  Constitutional: Negative.   All other systems reviewed and are negative.    Objective: Vital Signs: There were no vitals taken for this visit.  Physical Exam Vitals and nursing note reviewed.  Constitutional:      Appearance: He is well-developed.  Pulmonary:     Effort: Pulmonary  effort is normal.  Abdominal:     Palpations: Abdomen is soft.  Skin:    General: Skin is warm.  Neurological:     Mental Status: He is alert and oriented to person, place, and time.  Psychiatric:        Behavior: Behavior normal.        Thought Content: Thought content normal.        Judgment: Judgment normal.     Ortho Exam Examination of right shoulder shows normal active and passive range of motion.  No pain with impingement.  Rotator cuff strength is normal to manual muscle testing.  Acromion nontender.  He has pain with testing of the posterior labrum and the superior labrum.  Negative sulcus sign.  Negative apprehension.  Specialty Comments:  No specialty comments available.  Imaging: XR Shoulder Right  Result Date: 12/02/2021 No acute or structural abnormalities.  Questionable os acromiale.     PMFS History: There are no problems to display for this patient.  No past medical history on file.  No family history on file.  No past surgical history on file. Social History   Occupational History   Not on file  Tobacco Use   Smoking status: Never   Smokeless tobacco:  Never  Substance and Sexual Activity   Alcohol use: Not on file   Drug use: Not on file   Sexual activity: Not on file

## 2021-12-03 DIAGNOSIS — J069 Acute upper respiratory infection, unspecified: Secondary | ICD-10-CM | POA: Diagnosis not present

## 2021-12-03 DIAGNOSIS — H6981 Other specified disorders of Eustachian tube, right ear: Secondary | ICD-10-CM | POA: Diagnosis not present

## 2021-12-22 ENCOUNTER — Ambulatory Visit
Admission: RE | Admit: 2021-12-22 | Discharge: 2021-12-22 | Disposition: A | Discharge status: Home / Self Care | Payer: BC Managed Care – PPO | Source: Ambulatory Visit | Attending: Orthopaedic Surgery | Admitting: Orthopaedic Surgery

## 2021-12-22 DIAGNOSIS — S46011A Strain of muscle(s) and tendon(s) of the rotator cuff of right shoulder, initial encounter: Secondary | ICD-10-CM | POA: Diagnosis not present

## 2021-12-22 DIAGNOSIS — G8929 Other chronic pain: Secondary | ICD-10-CM

## 2021-12-22 DIAGNOSIS — M25511 Pain in right shoulder: Secondary | ICD-10-CM | POA: Diagnosis not present

## 2021-12-22 MED ORDER — IOPAMIDOL (ISOVUE-M 200) INJECTION 41%
13.0000 mL | Freq: Once | INTRAMUSCULAR | Status: AC
  Administered 2021-12-22: 13 mL via INTRA_ARTICULAR

## 2022-02-12 DIAGNOSIS — K429 Umbilical hernia without obstruction or gangrene: Secondary | ICD-10-CM | POA: Diagnosis not present

## 2022-03-02 DIAGNOSIS — L718 Other rosacea: Secondary | ICD-10-CM | POA: Diagnosis not present

## 2022-03-11 DIAGNOSIS — K429 Umbilical hernia without obstruction or gangrene: Secondary | ICD-10-CM | POA: Diagnosis not present

## 2022-04-09 ENCOUNTER — Ambulatory Visit: Payer: Self-pay | Admitting: General Surgery

## 2022-04-16 ENCOUNTER — Encounter (HOSPITAL_COMMUNITY): Payer: Self-pay

## 2022-04-16 DIAGNOSIS — M542 Cervicalgia: Secondary | ICD-10-CM | POA: Diagnosis not present

## 2022-04-16 NOTE — Patient Instructions (Signed)
DUE TO COVID-19 ONLY TWO VISITORS  (aged 39 and older)  ARE ALLOWED TO COME WITH YOU AND STAY IN THE WAITING ROOM ONLY DURING PRE OP AND PROCEDURE.   **NO VISITORS ARE ALLOWED IN THE SHORT STAY AREA OR RECOVERY ROOM!!**  IF YOU WILL BE ADMITTED INTO THE HOSPITAL YOU ARE ALLOWED ONLY FOUR SUPPORT PEOPLE DURING VISITATION HOURS ONLY (7 AM -8PM)   The support person(s) must pass our screening, gel in and out, and wear a mask at all times, including in the patient's room. Patients must also wear a mask when staff or their support person are in the room. Visitors GUEST BADGE MUST BE WORN VISIBLY  One adult visitor may remain with you overnight and MUST be in the room by 8 P.M.     Your procedure is scheduled on: 04/30/22   Report to [facility] Main Entrance    Report to admitting at  5:15 AM   Call this number if you have problems the morning of surgery [phone]   Do not eat food :After Midnight.   After Midnight you may have the following liquids until _4:30 _____ AM/  DAY OF SURGERY  Water Black Coffee (sugar ok, NO MILK/CREAM OR CREAMERS)  Tea (sugar ok, NO MILK/CREAM OR CREAMERS) regular and decaf                             Plain Jell-O (NO RED)                                           Fruit ices (not with fruit pulp, NO RED)                                     Popsicles (NO RED)                                                                  Juice: apple, WHITE grape, WHITE cranberry Sports drinks like Gatorade (NO RED)                  The day of surgery:  Drink ONE (1) Pre-Surgery Clear Ensure  at 4:15 AM the morning of surgery. Drink in one sitting. Do not sip.  This drink was given to you during your hospital  pre-op appointment visit. Nothing else to drink after completing the  Pre-Surgery Clear Ensure 4:30 AM          If you have questions, please contact your surgeon's office.   Oral Hygiene is also important to reduce your risk of infection.                                     Remember - BRUSH YOUR TEETH THE MORNING OF SURGERY WITH YOUR REGULAR TOOTHPASTE   Do NOT smoke after Midnight   Take these medicines the morning of surgery with A SIP OF WATER: Zyrtec, Rosuvastatin-Crestor  You may not have any metal on your body including , jewelry, and body piercing             Do not wear  lotions, powders, cologne, or deodorant                Men may shave face and neck.   Do not bring valuables to the hospital. [place].   Contacts, dentures or bridgework may not be worn into surgery.   Bring small overnight bag day of surgery.   DO NOT [place].     Patients discharged on the day of surgery will not be allowed to drive home.  Someone NEEDS to stay with you for the first 24 hours after anesthesia.   Special Instructions: Bring a copy of your healthcare power of attorney and living will documents    the day of surgery if you haven't scanned them before.              Please read over the following fact sheets you were given: IF YOU HAVE QUESTIONS ABOUT YOUR PRE-OP INSTRUCTIONS PLEASE CALL [phone]    [facility] - Preparing for Surgery Before surgery, you can play an important role.  Because skin is not sterile, your skin needs to be as free of germs as possible.  You can reduce the number of germs on your skin by washing with CHG (chlorahexidine gluconate) soap before surgery.  CHG is an antiseptic cleaner which kills germs and bonds with the skin to continue killing germs even after washing. Please DO NOT use if you have an allergy to CHG or antibacterial soaps.  If your skin becomes reddened/irritated stop using the CHG and inform your nurse when you arrive at Short Stay..  You may shave your face/neck. Please follow these instructions carefully:  1.  Shower with CHG Soap the night before surgery and the  morning  of Surgery.  2.  If you choose to wash your hair, wash your hair first as usual with your  normal  shampoo.  3.  After you shampoo, rinse your hair and body thoroughly to remove the  shampoo.                            4.  Use CHG as you would any other liquid soap.  You can apply chg directly  to the skin and wash                       Gently with a scrungie or clean washcloth.  5.  Apply the CHG Soap to your body ONLY FROM THE NECK DOWN.   Do not use on face/ open                           Wound or open sores. Avoid contact with eyes, ears mouth and genitals (private parts).                       Wash face,  Genitals (private parts) with your normal soap.             6.  Wash thoroughly, paying special attention to the area where your surgery  will be performed.  7.  Thoroughly rinse  your body with warm water from the neck down.  8.  DO NOT shower/wash with your normal soap after using and rinsing off  the CHG Soap.                9.  Pat yourself dry with a clean towel.            10.  Wear clean pajamas.            11.  Place clean sheets on your bed the night of your first shower and do not  sleep with pets. Day of Surgery : Do not apply any lotions/deodorants the morning of surgery.  Please wear clean clothes to the hospital/surgery center.  FAILURE TO FOLLOW THESE INSTRUCTIONS MAY RESULT IN THE CANCELLATION OF YOUR SURGERY   ________________________________________________________________________

## 2022-04-17 ENCOUNTER — Encounter (HOSPITAL_COMMUNITY)
Admission: RE | Admit: 2022-04-17 | Discharge: 2022-04-17 | Disposition: A | Discharge status: Home / Self Care | Payer: BC Managed Care – PPO | Source: Ambulatory Visit | Attending: General Surgery | Admitting: General Surgery

## 2022-04-17 ENCOUNTER — Other Ambulatory Visit: Payer: Self-pay

## 2022-04-17 ENCOUNTER — Encounter (HOSPITAL_COMMUNITY): Payer: Self-pay

## 2022-04-17 DIAGNOSIS — Z01818 Encounter for other preprocedural examination: Secondary | ICD-10-CM

## 2022-04-17 DIAGNOSIS — Z01812 Encounter for preprocedural laboratory examination: Secondary | ICD-10-CM | POA: Insufficient documentation

## 2022-04-17 HISTORY — DX: Hyperlipidemia, unspecified: E78.5

## 2022-04-17 LAB — CBC
HCT: 45.5 % (ref 39.0–52.0)
Hemoglobin: 15.4 g/dL (ref 13.0–17.0)
MCH: 29.8 pg (ref 26.0–34.0)
MCHC: 33.8 g/dL (ref 30.0–36.0)
MCV: 88 fL (ref 80.0–100.0)
Platelets: 244 10*3/uL (ref 150–400)
RBC: 5.17 MIL/uL (ref 4.22–5.81)
RDW: 12.5 % (ref 11.5–15.5)
WBC: 5.6 10*3/uL (ref 4.0–10.5)
nRBC: 0 % (ref 0.0–0.2)

## 2022-04-17 LAB — BASIC METABOLIC PANEL
Anion gap: 6 (ref 5–15)
BUN: 13 mg/dL (ref 6–20)
CO2: 27 mmol/L (ref 22–32)
Calcium: 9.7 mg/dL (ref 8.9–10.3)
Chloride: 107 mmol/L (ref 98–111)
Creatinine, Ser: 0.9 mg/dL (ref 0.61–1.24)
GFR, Estimated: >60 mL/min (ref >60)
Glucose, Bld: 100 mg/dL — ABNORMAL HIGH (ref 70–99)
Potassium: 4.1 mmol/L (ref 3.5–5.1)
Sodium: 140 mmol/L (ref 135–145)

## 2022-04-17 NOTE — Progress Notes (Signed)
Anesthesia note:  Bowel prep reminder:  NA  PCP - Dr. Oneida Arenas Cardiologist -no Other-   Chest x-ray - no EKG - no Stress Test - no ECHO -no  Cardiac Cath - no CABG-no Pacemaker/ICD device last checked:no  Sleep Study - no CPAP -   Pt is pre diabetic-no CBG at PAT visit- Fasting Blood Sugar at home- Checks Blood Sugar _____  Blood Thinner:no Blood Thinner Instructions: Aspirin Instructions: Last Dose:  Anesthesia review: /No    Patient denies shortness of breath, fever, cough and chest pain at PAT appointment Pt is fit. No SOB with activities   Patient verbalized understanding of instructions that were given to them at the PAT appointment. Patient was also instructed that they will need to review over the PAT instructions again at home before surgery.yes

## 2022-04-21 DIAGNOSIS — M542 Cervicalgia: Secondary | ICD-10-CM | POA: Diagnosis not present

## 2022-04-29 ENCOUNTER — Encounter (HOSPITAL_COMMUNITY): Payer: Self-pay | Admitting: General Surgery

## 2022-04-30 ENCOUNTER — Ambulatory Visit (HOSPITAL_COMMUNITY)
Admission: RE | Admit: 2022-04-30 | Discharge: 2022-04-30 | Disposition: A | Discharge status: Home / Self Care | Payer: BC Managed Care – PPO | Source: Ambulatory Visit | Attending: General Surgery | Admitting: General Surgery

## 2022-04-30 ENCOUNTER — Ambulatory Visit (HOSPITAL_COMMUNITY): Payer: BC Managed Care – PPO | Admitting: Certified Registered Nurse Anesthetist

## 2022-04-30 ENCOUNTER — Encounter (HOSPITAL_COMMUNITY)
Admission: RE | Disposition: A | Discharge status: Home / Self Care | Payer: Self-pay | Source: Ambulatory Visit | Attending: General Surgery

## 2022-04-30 ENCOUNTER — Other Ambulatory Visit: Payer: Self-pay

## 2022-04-30 ENCOUNTER — Encounter (HOSPITAL_COMMUNITY): Payer: Self-pay | Admitting: General Surgery

## 2022-04-30 DIAGNOSIS — K429 Umbilical hernia without obstruction or gangrene: Secondary | ICD-10-CM | POA: Insufficient documentation

## 2022-04-30 DIAGNOSIS — Z01818 Encounter for other preprocedural examination: Secondary | ICD-10-CM

## 2022-04-30 HISTORY — PX: UMBILICAL HERNIA REPAIR: SHX196

## 2022-04-30 SURGERY — REPAIR, HERNIA, UMBILICAL, ADULT
Anesthesia: General

## 2022-04-30 MED ORDER — OXYCODONE HCL 5 MG PO TABS
5.0000 mg | ORAL_TABLET | Freq: Once | ORAL | Status: AC | PRN
  Administered 2022-04-30: 5 mg via ORAL

## 2022-04-30 MED ORDER — ACETAMINOPHEN 500 MG PO TABS
1000.0000 mg | ORAL_TABLET | ORAL | Status: AC
  Administered 2022-04-30: 1000 mg via ORAL
  Filled 2022-04-30: qty 2

## 2022-04-30 MED ORDER — SUGAMMADEX SODIUM 200 MG/2ML IV SOLN
INTRAVENOUS | Status: DC | PRN
  Administered 2022-04-30: 200 mg via INTRAVENOUS

## 2022-04-30 MED ORDER — ROCURONIUM BROMIDE 100 MG/10ML IV SOLN
INTRAVENOUS | Status: DC | PRN
  Administered 2022-04-30: 80 mg via INTRAVENOUS

## 2022-04-30 MED ORDER — VANCOMYCIN HCL IN DEXTROSE 1-5 GM/200ML-% IV SOLN
1000.0000 mg | Freq: Once | INTRAVENOUS | Status: AC
  Administered 2022-04-30 (×2): 1000 mg via INTRAVENOUS
  Filled 2022-04-30: qty 200

## 2022-04-30 MED ORDER — MIDAZOLAM HCL 5 MG/5ML IJ SOLN
INTRAMUSCULAR | Status: DC | PRN
  Administered 2022-04-30: 2 mg via INTRAVENOUS

## 2022-04-30 MED ORDER — CHLORHEXIDINE GLUCONATE CLOTH 2 % EX PADS: 6.0000 | MEDICATED_PAD | Freq: Once | CUTANEOUS | Status: DC

## 2022-04-30 MED ORDER — IBUPROFEN 800 MG PO TABS: 800.0000 mg | ORAL_TABLET | Freq: Three times a day (TID) | ORAL | 0 refills | Status: AC | PRN

## 2022-04-30 MED ORDER — LACTATED RINGERS IV SOLN: INTRAVENOUS | Status: DC | PRN

## 2022-04-30 MED ORDER — HYDROMORPHONE HCL 1 MG/ML IJ SOLN: 0.2500 mg | INTRAMUSCULAR | Status: DC | PRN

## 2022-04-30 MED ORDER — BUPIVACAINE LIPOSOME 1.3 % IJ SUSP: 20.0000 mL | Freq: Once | INTRAMUSCULAR | Status: DC

## 2022-04-30 MED ORDER — LIDOCAINE HCL (CARDIAC) PF 100 MG/5ML IV SOSY
PREFILLED_SYRINGE | INTRAVENOUS | Status: DC | PRN
  Administered 2022-04-30: 50 mg via INTRAVENOUS

## 2022-04-30 MED ORDER — ENSURE PRE-SURGERY PO LIQD
296.0000 mL | Freq: Once | ORAL | Status: DC
  Filled 2022-04-30: qty 296

## 2022-04-30 MED ORDER — OXYCODONE HCL 5 MG PO TABS
ORAL_TABLET | ORAL | Status: AC
  Filled 2022-04-30: qty 1

## 2022-04-30 MED ORDER — FENTANYL CITRATE (PF) 100 MCG/2ML IJ SOLN
INTRAMUSCULAR | Status: DC | PRN
  Administered 2022-04-30: 50 ug via INTRAVENOUS

## 2022-04-30 MED ORDER — OXYCODONE HCL 5 MG/5ML PO SOLN: 5.0000 mg | Freq: Once | ORAL | Status: AC | PRN

## 2022-04-30 MED ORDER — BUPIVACAINE LIPOSOME 1.3 % IJ SUSP
INTRAMUSCULAR | Status: DC | PRN
  Administered 2022-04-30: 20 mL

## 2022-04-30 MED ORDER — ORAL CARE MOUTH RINSE: 15.0000 mL | Freq: Once | OROMUCOSAL | Status: AC

## 2022-04-30 MED ORDER — BUPIVACAINE-EPINEPHRINE 0.5% -1:200000 IJ SOLN
INTRAMUSCULAR | Status: DC | PRN
  Administered 2022-04-30: 30 mL

## 2022-04-30 MED ORDER — CELECOXIB 200 MG PO CAPS
400.0000 mg | ORAL_CAPSULE | ORAL | Status: AC
  Administered 2022-04-30: 400 mg via ORAL
  Filled 2022-04-30: qty 2

## 2022-04-30 MED ORDER — LIDOCAINE HCL (PF) 2 % IJ SOLN
INTRAMUSCULAR | Status: AC
  Filled 2022-04-30: qty 5

## 2022-04-30 MED ORDER — AMISULPRIDE (ANTIEMETIC) 5 MG/2ML IV SOLN: 10.0000 mg | Freq: Once | INTRAVENOUS | Status: DC | PRN

## 2022-04-30 MED ORDER — DEXMEDETOMIDINE HCL IN NACL 80 MCG/20ML IV SOLN
INTRAVENOUS | Status: DC | PRN
  Administered 2022-04-30 (×2): 8 ug via BUCCAL

## 2022-04-30 MED ORDER — MEPERIDINE HCL 50 MG/ML IJ SOLN: 6.2500 mg | INTRAMUSCULAR | Status: DC | PRN

## 2022-04-30 MED ORDER — ROCURONIUM BROMIDE 10 MG/ML (PF) SYRINGE
PREFILLED_SYRINGE | INTRAVENOUS | Status: AC
  Filled 2022-04-30: qty 10

## 2022-04-30 MED ORDER — BUPIVACAINE-EPINEPHRINE (PF) 0.5% -1:200000 IJ SOLN
INTRAMUSCULAR | Status: AC
  Filled 2022-04-30: qty 30

## 2022-04-30 MED ORDER — 0.9 % SODIUM CHLORIDE (POUR BTL) OPTIME
TOPICAL | Status: DC | PRN
  Administered 2022-04-30: 1000 mL

## 2022-04-30 MED ORDER — ONDANSETRON HCL 4 MG/2ML IJ SOLN
INTRAMUSCULAR | Status: DC | PRN
  Administered 2022-04-30: 4 mg via INTRAVENOUS

## 2022-04-30 MED ORDER — MIDAZOLAM HCL 2 MG/2ML IJ SOLN
INTRAMUSCULAR | Status: AC
  Filled 2022-04-30: qty 2

## 2022-04-30 MED ORDER — ONDANSETRON HCL 4 MG/2ML IJ SOLN
INTRAMUSCULAR | Status: AC
  Filled 2022-04-30: qty 2

## 2022-04-30 MED ORDER — PROMETHAZINE HCL 25 MG/ML IJ SOLN: 6.2500 mg | INTRAMUSCULAR | Status: DC | PRN

## 2022-04-30 MED ORDER — LACTATED RINGERS IV SOLN: INTRAVENOUS | Status: DC

## 2022-04-30 MED ORDER — FENTANYL CITRATE (PF) 100 MCG/2ML IJ SOLN
INTRAMUSCULAR | Status: AC
  Filled 2022-04-30: qty 2

## 2022-04-30 MED ORDER — PROPOFOL 10 MG/ML IV BOLUS
INTRAVENOUS | Status: DC | PRN
  Administered 2022-04-30: 200 mg via INTRAVENOUS

## 2022-04-30 MED ORDER — BUPIVACAINE LIPOSOME 1.3 % IJ SUSP
INTRAMUSCULAR | Status: AC
  Filled 2022-04-30: qty 20

## 2022-04-30 MED ORDER — OXYCODONE HCL 5 MG PO TABS: 5.0000 mg | ORAL_TABLET | Freq: Four times a day (QID) | ORAL | 0 refills | Status: AC | PRN

## 2022-04-30 MED ORDER — PROPOFOL 10 MG/ML IV BOLUS
INTRAVENOUS | Status: AC
  Filled 2022-04-30: qty 20

## 2022-04-30 MED ORDER — CHLORHEXIDINE GLUCONATE 0.12 % MT SOLN
15.0000 mL | Freq: Once | OROMUCOSAL | Status: AC
  Administered 2022-04-30: 15 mL via OROMUCOSAL

## 2022-04-30 MED ORDER — DEXAMETHASONE SODIUM PHOSPHATE 10 MG/ML IJ SOLN
INTRAMUSCULAR | Status: AC
  Filled 2022-04-30: qty 1

## 2022-04-30 SURGICAL SUPPLY — 30 items
BAG COUNTER SPONGE SURGICOUNT (BAG) IMPLANT
BINDER ABDOMINAL 12 ML 46-62 (SOFTGOODS) IMPLANT
BLADE SURG SZ10 CARB STEEL (BLADE) ×1 IMPLANT
CHLORAPREP W/TINT 26 (MISCELLANEOUS) ×1 IMPLANT
COVER SURGICAL LIGHT HANDLE (MISCELLANEOUS) ×1 IMPLANT
DERMABOND ADVANCED .7 DNX12 (GAUZE/BANDAGES/DRESSINGS) ×1 IMPLANT
DRAPE LAPAROSCOPIC ABDOMINAL (DRAPES) ×1 IMPLANT
ELECT REM PT RETURN 15FT ADLT (MISCELLANEOUS) ×1 IMPLANT
GLOVE BIOGEL PI IND STRL 7.0 (GLOVE) ×1 IMPLANT
GLOVE SURG SS PI 7.0 STRL IVOR (GLOVE) ×1 IMPLANT
GOWN STRL REUS W/ TWL LRG LVL3 (GOWN DISPOSABLE) ×1 IMPLANT
GOWN STRL REUS W/ TWL XL LVL3 (GOWN DISPOSABLE) IMPLANT
GOWN STRL REUS W/TWL LRG LVL3 (GOWN DISPOSABLE) ×1
GOWN STRL REUS W/TWL XL LVL3 (GOWN DISPOSABLE)
KIT BASIN OR (CUSTOM PROCEDURE TRAY) ×2 IMPLANT
KIT TURNOVER KIT A (KITS) IMPLANT
MARKER SKIN DUAL TIP RULER LAB (MISCELLANEOUS) ×1 IMPLANT
NEEDLE HYPO 22GX1.5 SAFETY (NEEDLE) ×1 IMPLANT
PENCIL SMOKE EVACUATOR (MISCELLANEOUS) ×1 IMPLANT
SPIKE FLUID TRANSFER (MISCELLANEOUS) ×1 IMPLANT
SPONGE T-LAP 4X18 ~~LOC~~+RFID (SPONGE) ×1 IMPLANT
SUT MNCRL AB 4-0 PS2 18 (SUTURE) ×1 IMPLANT
SUT NOVA NAB GS-21 0 18 T12 DT (SUTURE) IMPLANT
SUT PDS AB 0 CT1 36 (SUTURE) IMPLANT
SUT VIC AB 3-0 SH 27 (SUTURE) ×1
SUT VIC AB 3-0 SH 27X BRD (SUTURE) ×1 IMPLANT
SYR CONTROL 10ML LL (SYRINGE) ×1 IMPLANT
TOWEL OR 17X26 10 PK STRL BLUE (TOWEL DISPOSABLE) ×1 IMPLANT
TOWEL OR NON WOVEN STRL DISP B (DISPOSABLE) ×1 IMPLANT
YANKAUER SUCT BULB TIP 10FT TU (MISCELLANEOUS) ×1 IMPLANT

## 2022-04-30 NOTE — Op Note (Signed)
PATIENT:  Jack Pearson  41 y.o. male  PRE-OPERATIVE DIAGNOSIS:  UMBILICAL HERNIA  POST-OPERATIVE DIAGNOSIS:  UMBILICAL HERNIA  PROCEDURE:  Procedure(s): OPEN UMBILICAL HERNIA REPAIR   SURGEON:  Surgeon(s): Keyasia Jolliff, De Blanch, MD  ASSISTANT: none   ANESTHESIA:   local and general  Indications for procedure: Jack Pearson is a 41 y.o. year old male with symptoms of abdominal pain and periumbilical hernia.  Description of procedure: The patient was brought into the operative suite. Anesthesia was administered with General endotracheal anesthesia. WHO checklist was applied. The patient was then placed in supine. The area was prepped and draped in the usual sterile fashion.  Next the infraumbilical skin was anesthetized with Marcaine/Exparel mix. A semilunar infraumbilical incision was made. Cautery and blunt dissection was used to dissect down to the fascia. The hernia sac was dissected free from surrounding tissues in 360 degrees. The umbilical skin was dissected free of the hernia sac with cautery. The hernia sac was reduced and contained no visceral structures.  The hernia defect was 1.5 cm in diameter. The hernia sac was removed. Due to the size of the hernia, Due to size of hernia, no mesh was utilized. The fascial defect was then primarily closed with interrupted 0 PDS sutures. The umbilical skin was sutured to the fascia with a 3-0 vicryl. The deep dermal space was closed with a 3-0 vicryl. Marcaine/Exparel mix was injected into the muscle layer and around the fascia. The skin was closed with a 4-0 monocryl subcuticular suture. Dermabond was put in place for dressing. The patient awoke from anesthesia and was brought to pacu in stable condition. All counts were correct.  Findings: 1.5 cm umbilical hernia  Specimen: none  Blood loss: 20 ml  Local anesthesia: 50 ml Marcaine/Exparel mix  Complications: none  PLAN OF CARE: Discharge to home after PACU  PATIENT DISPOSITION:  PACU -  hemodynamically stable.  Feliciana Rossetti, M.D. General, Bariatric, & Minimally Invasive Surgery [facility], [place]  04/30/2022 8:32 AM

## 2022-04-30 NOTE — Anesthesia Procedure Notes (Signed)
Procedure Name: Intubation Date/Time: 04/30/2022 7:38 AM  Performed by: Cleda Clarks, CRNAPre-anesthesia Checklist: Patient identified, Emergency Drugs available, Suction available and Patient being monitored Patient Re-evaluated:Patient Re-evaluated prior to induction Oxygen Delivery Method: Circle system utilized Preoxygenation: Pre-oxygenation with 100% oxygen Induction Type: IV induction Ventilation: Mask ventilation without difficulty Laryngoscope Size: Miller and 2 Grade View: Grade II Tube type: Oral Tube size: 7.0 mm Number of attempts: 1 Airway Equipment and Method: Stylet and Oral airway Placement Confirmation: ETT inserted through vocal cords under direct vision, positive ETCO2 and breath sounds checked- equal and bilateral Secured at: 21 cm Tube secured with: Tape Dental Injury: Teeth and Oropharynx as per pre-operative assessment

## 2022-04-30 NOTE — Anesthesia Preprocedure Evaluation (Signed)
Anesthesia Evaluation  Patient identified by MRN, date of birth, ID band Patient awake    Reviewed: Allergy & Precautions, H&P , NPO status , Patient's Chart, lab work & pertinent test results  Airway Mallampati: II  TM Distance: >3 FB Neck ROM: Full    Dental no notable dental hx.    Pulmonary neg pulmonary ROS   Pulmonary exam normal breath sounds clear to auscultation       Cardiovascular negative cardio ROS Normal cardiovascular exam Rhythm:Regular Rate:Normal     Neuro/Psych negative neurological ROS  negative psych ROS   GI/Hepatic negative GI ROS, Neg liver ROS,,,  Endo/Other  negative endocrine ROS    Renal/GU negative Renal ROS  negative genitourinary   Musculoskeletal negative musculoskeletal ROS (+)    Abdominal   Peds negative pediatric ROS (+)  Hematology negative hematology ROS (+)   Anesthesia Other Findings   Reproductive/Obstetrics negative OB ROS                             Anesthesia Physical Anesthesia Plan  ASA: 2  Anesthesia Plan: General   Post-op Pain Management: Tylenol PO (pre-op)* and Celebrex PO (pre-op)*   Induction: Intravenous  PONV Risk Score and Plan: 2 and Ondansetron, Midazolam and Treatment may vary due to age or medical condition  Airway Management Planned: Oral ETT and Simple Face Mask  Additional Equipment:   Intra-op Plan:   Post-operative Plan: Extubation in OR  Informed Consent: I have reviewed the patients History and Physical, chart, labs and discussed the procedure including the risks, benefits and alternatives for the proposed anesthesia with the patient or authorized representative who has indicated his/her understanding and acceptance.     Dental advisory given  Plan Discussed with: CRNA  Anesthesia Plan Comments:        Anesthesia Quick Evaluation

## 2022-04-30 NOTE — Anesthesia Postprocedure Evaluation (Signed)
Anesthesia Post Note  Patient: Jack Pearson  Procedure(s) Performed: OPEN UMBILICAL HERNIA REPAIR     Patient location during evaluation: PACU Anesthesia Type: General Level of consciousness: awake and alert Pain management: pain level controlled Vital Signs Assessment: post-procedure vital signs reviewed and stable Respiratory status: spontaneous breathing, nonlabored ventilation and respiratory function stable Cardiovascular status: blood pressure returned to baseline and stable Postop Assessment: no apparent nausea or vomiting Anesthetic complications: no   No notable events documented.  Last Vitals:  Vitals:   04/30/22 0900 04/30/22 0925  BP: 126/74 123/68  Pulse: 72 71  Resp: 19 15  Temp:  36.6 C  SpO2: 97% 100%    Last Pain:  Vitals:   04/30/22 0925  TempSrc:   PainSc: 0-No pain                 Lowella Curb

## 2022-04-30 NOTE — Transfer of Care (Signed)
Immediate Anesthesia Transfer of Care Note  Patient: Jack Pearson  Procedure(s) Performed: OPEN UMBILICAL HERNIA REPAIR  Patient Location: PACU  Anesthesia Type:General  Level of Consciousness: awake, alert , and oriented  Airway & Oxygen Therapy: Patient Spontanous Breathing and Patient connected to face mask oxygen  Post-op Assessment: Report given to RN and Post -op Vital signs reviewed and stable  Post vital signs: Reviewed and stable  Last Vitals:  Vitals Value Taken Time  BP 113/70 04/30/22 0830  Temp    Pulse 89 04/30/22 0831  Resp 17 04/30/22 0831  SpO2 100 % 04/30/22 0831  Vitals shown include unvalidated device data.  Last Pain:  Vitals:   04/30/22 0545  TempSrc: Oral         Complications: No notable events documented.

## 2022-04-30 NOTE — H&P (Signed)
   Chief Complaint: New Consultation (UMBILICAL HERNIA)   History of Present Illness: Jack Pearson is a 41 y.o. male who is seen today as an office consultation for evaluation of New Consultation (UMBILICAL HERNIA) . He noticed an umbilical bulge a few weeks ago. He went to a walk in clinic for the diagnosis. He notices some vague discomfort with moving. He denies nausea or vomiting.  He does not smoke He does not have diabetes.  Review of Systems: A complete review of systems was obtained from the patient. I have reviewed this information and discussed as appropriate with the patient. See HPI as well for other ROS.  Review of Systems Constitutional: Negative. HENT: Negative. Eyes: Negative. Respiratory: Negative. Cardiovascular: Negative. Gastrointestinal: Negative. Genitourinary: Negative. Musculoskeletal: Negative. Skin: Negative. Neurological: Negative. Endo/Heme/Allergies: Negative. Psychiatric/Behavioral: Negative.  Medical History: History reviewed. No pertinent past medical history.  There is no problem list on file for this patient.  Past Surgical History: Procedure Laterality Date UNLISTED PROCEDURE PHARYNX/ADENOIDS/TONSILS 1994   Allergies Allergen Reactions Cefaclor Hives  Current Outpatient Medications on File Prior to Visit Medication Sig Dispense Refill cetirizine (ZYRTEC) 10 MG chewable tablet Take 10 mg by mouth once daily rosuvastatin (CRESTOR) 5 MG tablet Take 5 mg by mouth once daily  No current facility-administered medications on file prior to visit.  Family History Problem Relation Age of Onset High blood pressure (Hypertension) Mother Hyperlipidemia (Elevated cholesterol) Father   Social History  Tobacco Use Smoking Status Never Smokeless Tobacco Never   Social History  Socioeconomic History Marital status: Married Tobacco Use Smoking status: Never Smokeless tobacco: Never Substance and Sexual Activity Alcohol use:  Yes Alcohol/week: 2.0 standard drinks Types: 2 Glasses of wine per week Drug use: Never  Objective:  Vitals: 03/11/22 1020 BP: (!) 120/98 Pulse: 85 Temp: 36.9 C (98.4 F) SpO2: 98% Weight: 83.9 kg (185 lb) Height: 170.2 cm (5\' 7" )  Body mass index is 28.98 kg/m.  Physical Exam Constitutional: Appearance: Normal appearance. HENT: Head: Normocephalic and atraumatic. Pulmonary: Effort: Pulmonary effort is normal. Abdominal: Comments: Small reducible umbilical hernia Musculoskeletal: General: Normal range of motion. Cervical back: Normal range of motion. Neurological: General: No focal deficit present. Mental Status: He is alert and oriented to person, place, and time. Mental status is at baseline. Psychiatric: Mood and Affect: Mood normal. Behavior: Behavior normal. Thought Content: Thought content normal.   Labs, Imaging and Diagnostic Testing: I reviewed notes by  Assessment and Plan:  Diagnoses and all orders for this visit:  Umbilical hernia without obstruction or gangrene    The patient has a symptomatic reducible hernia. We discussed the etiology of his hernia, the risk of it enlarging, incarceration, obstruction, strangulation, and that it is unlikely to get smaller or better on its own. We discussed operative options of laparoscopic vs open repair with mesh including the risks of recurrence, injury to intestines or abdominal organs, or chronic pain associated with mesh. We decided to proceed with open primary umbilical hernia repair as outpatient.

## 2022-05-01 ENCOUNTER — Encounter (HOSPITAL_COMMUNITY): Payer: Self-pay | Admitting: General Surgery

## 2022-05-26 DIAGNOSIS — H04123 Dry eye syndrome of bilateral lacrimal glands: Secondary | ICD-10-CM | POA: Diagnosis not present

## 2022-05-26 DIAGNOSIS — H01136 Eczematous dermatitis of left eye, unspecified eyelid: Secondary | ICD-10-CM | POA: Diagnosis not present

## 2022-07-03 DIAGNOSIS — L239 Allergic contact dermatitis, unspecified cause: Secondary | ICD-10-CM | POA: Diagnosis not present

## 2022-07-16 DIAGNOSIS — Z79899 Other long term (current) drug therapy: Secondary | ICD-10-CM | POA: Diagnosis not present

## 2022-07-16 DIAGNOSIS — L718 Other rosacea: Secondary | ICD-10-CM | POA: Diagnosis not present

## 2022-07-16 DIAGNOSIS — Z0000 Encounter for general adult medical examination without abnormal findings: Secondary | ICD-10-CM | POA: Diagnosis not present

## 2022-07-16 DIAGNOSIS — H04123 Dry eye syndrome of bilateral lacrimal glands: Secondary | ICD-10-CM | POA: Diagnosis not present

## 2022-07-16 DIAGNOSIS — E7800 Pure hypercholesterolemia, unspecified: Secondary | ICD-10-CM | POA: Diagnosis not present

## 2022-07-16 DIAGNOSIS — E559 Vitamin D deficiency, unspecified: Secondary | ICD-10-CM | POA: Diagnosis not present

## 2022-07-16 DIAGNOSIS — R3129 Other microscopic hematuria: Secondary | ICD-10-CM | POA: Diagnosis not present

## 2022-07-23 DIAGNOSIS — R3121 Asymptomatic microscopic hematuria: Secondary | ICD-10-CM | POA: Diagnosis not present

## 2022-07-30 DIAGNOSIS — L309 Dermatitis, unspecified: Secondary | ICD-10-CM | POA: Diagnosis not present

## 2022-07-30 DIAGNOSIS — L718 Other rosacea: Secondary | ICD-10-CM | POA: Diagnosis not present

## 2022-08-04 DIAGNOSIS — R3121 Asymptomatic microscopic hematuria: Secondary | ICD-10-CM | POA: Diagnosis not present

## 2022-08-04 DIAGNOSIS — K7689 Other specified diseases of liver: Secondary | ICD-10-CM | POA: Diagnosis not present

## 2022-08-04 DIAGNOSIS — R3129 Other microscopic hematuria: Secondary | ICD-10-CM | POA: Diagnosis not present

## 2022-08-10 DIAGNOSIS — R3121 Asymptomatic microscopic hematuria: Secondary | ICD-10-CM | POA: Diagnosis not present

## 2022-10-20 DIAGNOSIS — H11122 Conjunctival concretions, left eye: Secondary | ICD-10-CM | POA: Diagnosis not present

## 2022-12-24 DIAGNOSIS — B078 Other viral warts: Secondary | ICD-10-CM | POA: Diagnosis not present

## 2022-12-24 DIAGNOSIS — L538 Other specified erythematous conditions: Secondary | ICD-10-CM | POA: Diagnosis not present

## 2022-12-24 DIAGNOSIS — R208 Other disturbances of skin sensation: Secondary | ICD-10-CM | POA: Diagnosis not present

## 2022-12-24 DIAGNOSIS — L298 Other pruritus: Secondary | ICD-10-CM | POA: Diagnosis not present

## 2023-01-28 DIAGNOSIS — L538 Other specified erythematous conditions: Secondary | ICD-10-CM | POA: Diagnosis not present

## 2023-01-28 DIAGNOSIS — R238 Other skin changes: Secondary | ICD-10-CM | POA: Diagnosis not present

## 2023-01-28 DIAGNOSIS — L298 Other pruritus: Secondary | ICD-10-CM | POA: Diagnosis not present

## 2023-01-28 DIAGNOSIS — B078 Other viral warts: Secondary | ICD-10-CM | POA: Diagnosis not present

## 2023-02-09 ENCOUNTER — Ambulatory Visit: Payer: BC Managed Care – PPO | Admitting: Orthopaedic Surgery

## 2023-02-17 ENCOUNTER — Encounter: Payer: Self-pay | Admitting: Orthopaedic Surgery

## 2023-02-17 ENCOUNTER — Other Ambulatory Visit (INDEPENDENT_AMBULATORY_CARE_PROVIDER_SITE_OTHER): Payer: BC Managed Care – PPO

## 2023-02-17 ENCOUNTER — Ambulatory Visit (INDEPENDENT_AMBULATORY_CARE_PROVIDER_SITE_OTHER): Payer: BC Managed Care – PPO | Admitting: Orthopaedic Surgery

## 2023-02-17 DIAGNOSIS — M25551 Pain in right hip: Secondary | ICD-10-CM

## 2023-02-17 DIAGNOSIS — M25552 Pain in left hip: Secondary | ICD-10-CM

## 2023-02-17 NOTE — Progress Notes (Signed)
Office Visit Note   Patient: Jack Pearson           Date of Birth: 1980/06/22           MRN: [id number] Visit Date: 02/17/2023              Requested by: Blair Heys, MD [address] [place],  [place] [address] PCP: Blair Heys, MD   Assessment & Plan: Visit Diagnoses:  1. Bilateral hip pain     Plan: Handsome is a 42 year old male with minor bilateral adductor strain from recent overuse.  Low suspicion for labral tears or structural abnormalities.  Recommend relative rest and modify activity as needed.  Have recommended more regular stretching and I even recommended going to stretch zone.  Follow-up as needed.  Follow-Up Instructions: No follow-ups on file.   Orders:  Orders Placed This Encounter  Procedures   XR HIPS BILAT W OR W/O PELVIS 3-4 VIEWS   No orders of the defined types were placed in this encounter.     Procedures: No procedures performed   Clinical Data: No additional findings.   Subjective: Chief Complaint  Patient presents with   Right Hip - Pain   Left Hip - Pain    HPI Jack Pearson is a 42 year old gentleman who I know well from tennis who comes in for evaluation of bilateral groin discomfort.  This started after playing a long singles tennis match a month ago.  Denies any specific injuries or trauma.  It has gotten better with rest but he feels that he feels a pulling sensation in the adductors when he makes lateral movements or when he stretches.  Denies any lateral hip pain or radicular symptoms.  He has no real symptoms when he is doing activity in a straight line.  Review of Systems  Constitutional: Negative.   HENT: Negative.    Eyes: Negative.   Respiratory: Negative.    Cardiovascular: Negative.   Gastrointestinal: Negative.   Endocrine: Negative.   Genitourinary: Negative.   Skin: Negative.   Allergic/Immunologic: Negative.   Neurological: Negative.   Hematological: Negative.   Psychiatric/Behavioral: Negative.     All other systems reviewed and are negative.    Objective: Vital Signs: There were no vitals taken for this visit.  Physical Exam Vitals and nursing note reviewed.  Constitutional:      Appearance: He is well-developed.  HENT:     Head: Normocephalic and atraumatic.  Eyes:     Pupils: Pupils are equal, round, and reactive to light.  Pulmonary:     Effort: Pulmonary effort is normal.  Abdominal:     Palpations: Abdomen is soft.  Musculoskeletal:        General: Normal range of motion.     Cervical back: Neck supple.  Skin:    General: Skin is warm.  Neurological:     Mental Status: He is alert and oriented to person, place, and time.  Psychiatric:        Behavior: Behavior normal.        Thought Content: Thought content normal.        Judgment: Judgment normal.     Ortho Exam Examination bilateral hips shows normal range of motion without pain.  Negative FADIR.  Negative Stinchfield.  No trochanteric tenderness.  Feels pulling sensation when abducting the legs.  No pain in the groin with percussion of the heel. Specialty Comments:  No specialty comments available.  Imaging: XR HIPS BILAT W OR  W/O PELVIS 3-4 VIEWS  Result Date: 02/17/2023 No acute or structural abnormalities    PMFS History: There are no problems to display for this patient.  History reviewed. No pertinent past medical history.  No family history on file.  Past Surgical History:  Procedure Laterality Date   TONSILLECTOMY     age 24   UMBILICAL HERNIA REPAIR N/A 04/30/2022   Procedure: OPEN UMBILICAL HERNIA REPAIR;  Surgeon: Sheliah Hatch, De Blanch, MD;  Location: [facility];  Service: General;  Laterality: N/A;   Social History   Occupational History   Not on file  Tobacco Use   Smoking status: Never   Smokeless tobacco: Never  Vaping Use   Vaping status: Never Used  Substance and Sexual Activity   Alcohol use: Yes    Alcohol/week: 5.0 standard drinks of alcohol    Types: 5 Standard drinks  or equivalent per week   Drug use: Never   Sexual activity: Yes

## 2023-04-09 DIAGNOSIS — H01115 Allergic dermatitis of left lower eyelid: Secondary | ICD-10-CM | POA: Diagnosis not present

## 2023-04-09 DIAGNOSIS — H01004 Unspecified blepharitis left upper eyelid: Secondary | ICD-10-CM | POA: Diagnosis not present

## 2023-04-09 DIAGNOSIS — H01001 Unspecified blepharitis right upper eyelid: Secondary | ICD-10-CM | POA: Diagnosis not present

## 2023-06-20 IMAGING — CR DG CHEST 2V
2 series · 2 of 2 positions shown · non-contrast
Comparison: None.

CLINICAL DATA: Cough and wheezing for 2 weeks.

EXAM:
CHEST - 2 VIEW

[w chest pa]
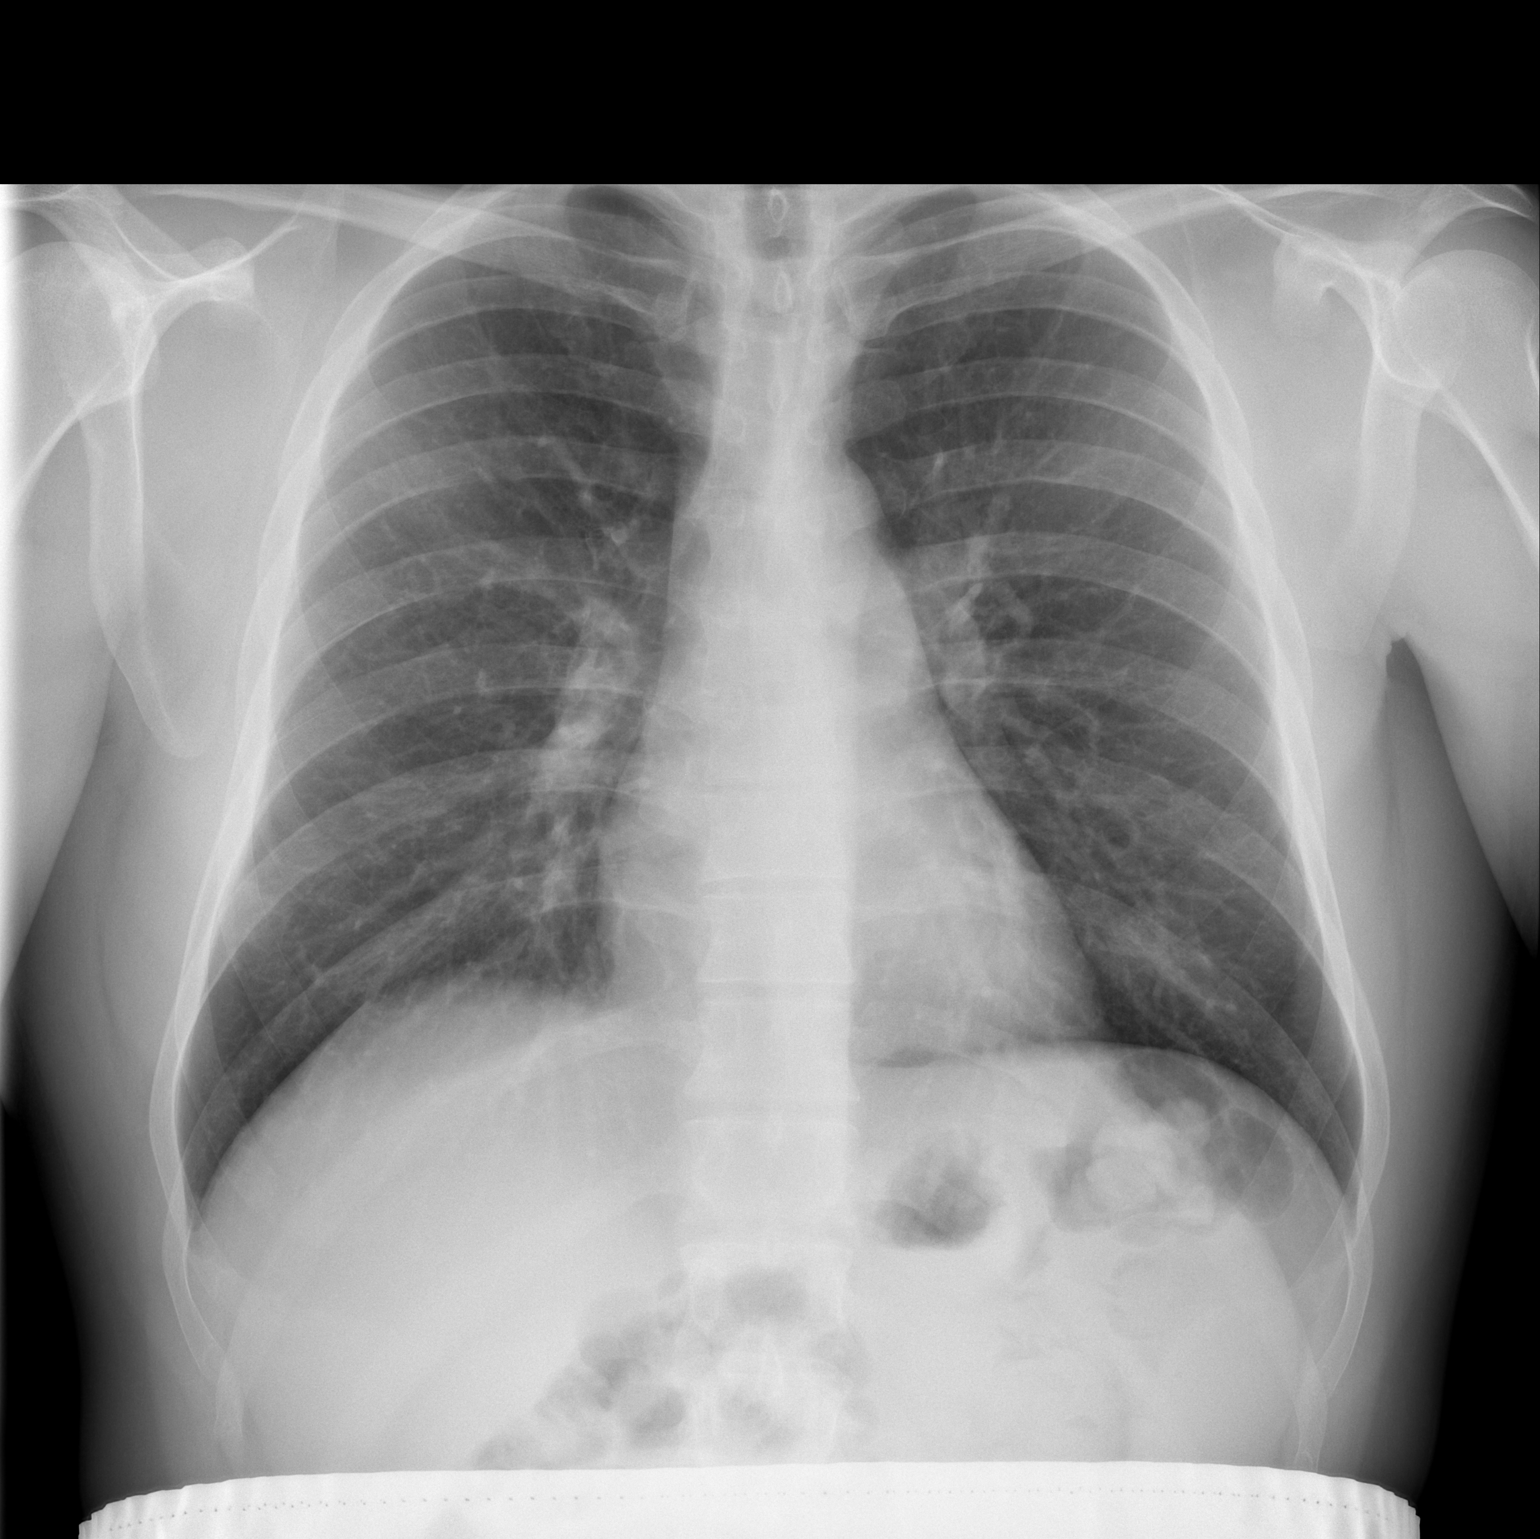

[w chest lat]
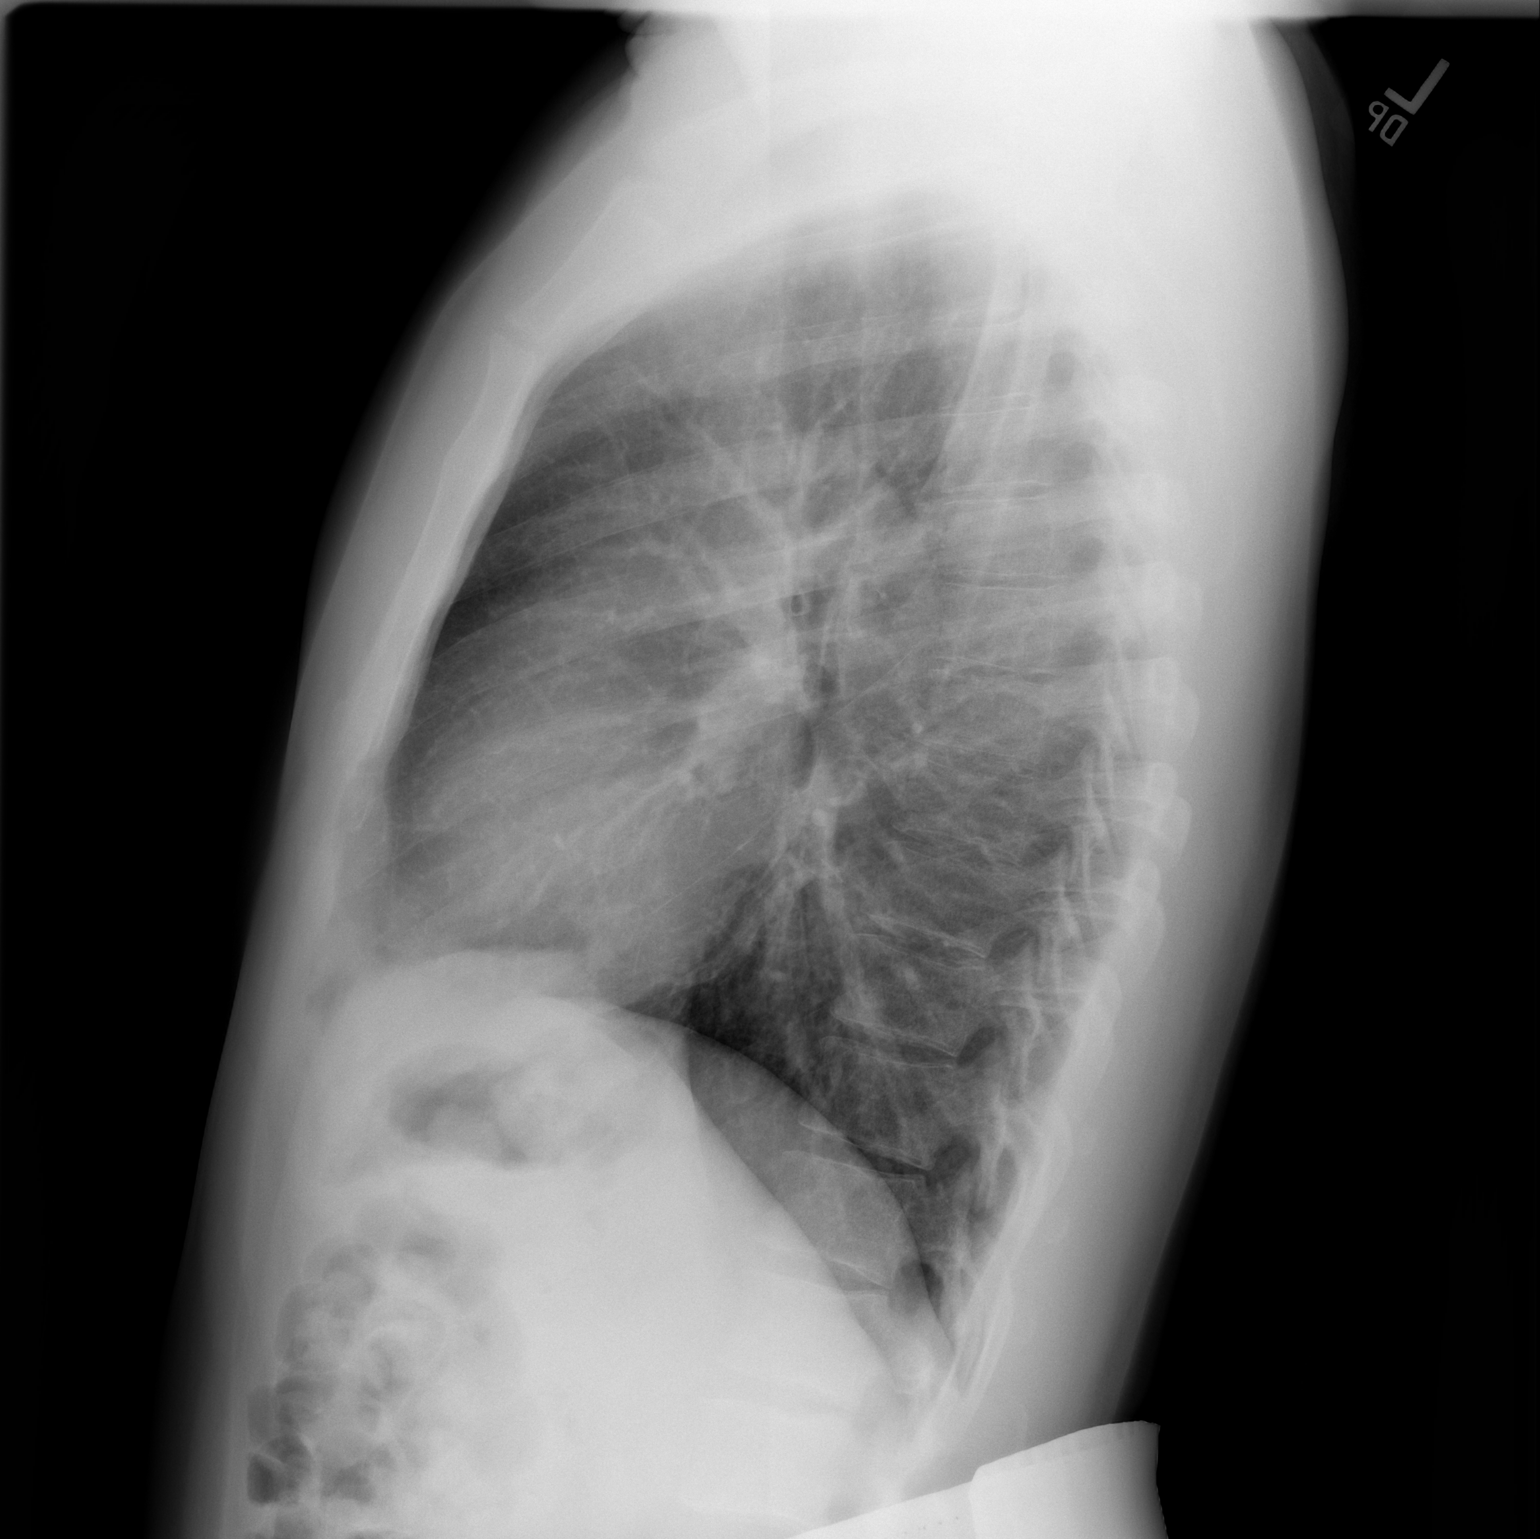

[2 of 2 positions shown; findings below may reference images not displayed]

FINDINGS: The heart size and mediastinal contours are within normal limits.
Both lungs are clear. The visualized skeletal structures are
unremarkable.
IMPRESSION: Normal exam.

## 2023-07-21 DIAGNOSIS — Z131 Encounter for screening for diabetes mellitus: Secondary | ICD-10-CM | POA: Diagnosis not present

## 2023-07-21 DIAGNOSIS — E782 Mixed hyperlipidemia: Secondary | ICD-10-CM | POA: Diagnosis not present

## 2023-07-21 DIAGNOSIS — Z0000 Encounter for general adult medical examination without abnormal findings: Secondary | ICD-10-CM | POA: Diagnosis not present

## 2023-08-05 DIAGNOSIS — B078 Other viral warts: Secondary | ICD-10-CM | POA: Diagnosis not present

## 2023-08-05 DIAGNOSIS — L218 Other seborrheic dermatitis: Secondary | ICD-10-CM | POA: Diagnosis not present

## 2023-08-05 DIAGNOSIS — L2989 Other pruritus: Secondary | ICD-10-CM | POA: Diagnosis not present

## 2023-08-05 DIAGNOSIS — L538 Other specified erythematous conditions: Secondary | ICD-10-CM | POA: Diagnosis not present

## 2023-08-05 DIAGNOSIS — R238 Other skin changes: Secondary | ICD-10-CM | POA: Diagnosis not present

## 2023-08-05 DIAGNOSIS — L718 Other rosacea: Secondary | ICD-10-CM | POA: Diagnosis not present

## 2023-08-23 ENCOUNTER — Encounter: Payer: Self-pay | Admitting: Gastroenterology

## 2023-08-25 DIAGNOSIS — R1013 Epigastric pain: Secondary | ICD-10-CM | POA: Diagnosis not present

## 2023-09-02 DIAGNOSIS — R1013 Epigastric pain: Secondary | ICD-10-CM | POA: Diagnosis not present

## 2023-09-14 DIAGNOSIS — L2989 Other pruritus: Secondary | ICD-10-CM | POA: Diagnosis not present

## 2023-09-14 DIAGNOSIS — B078 Other viral warts: Secondary | ICD-10-CM | POA: Diagnosis not present

## 2023-09-14 DIAGNOSIS — L538 Other specified erythematous conditions: Secondary | ICD-10-CM | POA: Diagnosis not present

## 2023-09-14 DIAGNOSIS — Z789 Other specified health status: Secondary | ICD-10-CM | POA: Diagnosis not present

## 2023-10-08 ENCOUNTER — Ambulatory Visit: Admitting: Gastroenterology

## 2023-10-14 DIAGNOSIS — L2989 Other pruritus: Secondary | ICD-10-CM | POA: Diagnosis not present

## 2023-10-14 DIAGNOSIS — B078 Other viral warts: Secondary | ICD-10-CM | POA: Diagnosis not present

## 2023-10-14 DIAGNOSIS — Z789 Other specified health status: Secondary | ICD-10-CM | POA: Diagnosis not present

## 2023-10-14 DIAGNOSIS — L538 Other specified erythematous conditions: Secondary | ICD-10-CM | POA: Diagnosis not present

## 2023-11-23 DIAGNOSIS — L719 Rosacea, unspecified: Secondary | ICD-10-CM | POA: Diagnosis not present

## 2023-11-23 DIAGNOSIS — J029 Acute pharyngitis, unspecified: Secondary | ICD-10-CM | POA: Diagnosis not present

## 2023-11-23 DIAGNOSIS — Z23 Encounter for immunization: Secondary | ICD-10-CM | POA: Diagnosis not present

## 2023-11-29 DIAGNOSIS — L719 Rosacea, unspecified: Secondary | ICD-10-CM | POA: Diagnosis not present

## 2023-11-29 DIAGNOSIS — B079 Viral wart, unspecified: Secondary | ICD-10-CM | POA: Diagnosis not present

## 2023-11-29 DIAGNOSIS — L309 Dermatitis, unspecified: Secondary | ICD-10-CM | POA: Diagnosis not present

## 2024-01-10 DIAGNOSIS — L719 Rosacea, unspecified: Secondary | ICD-10-CM | POA: Diagnosis not present

## 2024-01-10 DIAGNOSIS — L309 Dermatitis, unspecified: Secondary | ICD-10-CM | POA: Diagnosis not present

## 2024-01-10 DIAGNOSIS — B079 Viral wart, unspecified: Secondary | ICD-10-CM | POA: Diagnosis not present

## 2024-02-02 DIAGNOSIS — E782 Mixed hyperlipidemia: Secondary | ICD-10-CM | POA: Diagnosis not present

## 2024-03-01 DIAGNOSIS — H6123 Impacted cerumen, bilateral: Secondary | ICD-10-CM | POA: Diagnosis not present

## 2024-03-14 DIAGNOSIS — L814 Other melanin hyperpigmentation: Secondary | ICD-10-CM | POA: Diagnosis not present

## 2024-03-14 DIAGNOSIS — L918 Other hypertrophic disorders of the skin: Secondary | ICD-10-CM | POA: Diagnosis not present

## 2024-03-14 DIAGNOSIS — H938X1 Other specified disorders of right ear: Secondary | ICD-10-CM | POA: Diagnosis not present

## 2024-03-14 DIAGNOSIS — L821 Other seborrheic keratosis: Secondary | ICD-10-CM | POA: Diagnosis not present

## 2024-03-16 ENCOUNTER — Other Ambulatory Visit (INDEPENDENT_AMBULATORY_CARE_PROVIDER_SITE_OTHER): Payer: Self-pay | Admitting: Geriatric Medicine

## 2024-03-24 DIAGNOSIS — R002 Palpitations: Secondary | ICD-10-CM | POA: Diagnosis not present

## 2024-03-24 DIAGNOSIS — I517 Cardiomegaly: Secondary | ICD-10-CM | POA: Diagnosis not present
